# Patient Record
Sex: Male | Born: 1970 | Race: Black or African American | Hispanic: No | Marital: Single | Smoking: Current every day smoker
Health system: Southern US, Community
[De-identification: ages and names within clinical notes are randomized; demographics above are authoritative.]

## PROBLEM LIST (undated history)

## (undated) DIAGNOSIS — F259 Schizoaffective disorder, unspecified: Secondary | ICD-10-CM

---

## 2007-08-01 ENCOUNTER — Ambulatory Visit: Payer: Self-pay | Admitting: *Deleted

## 2007-08-01 ENCOUNTER — Inpatient Hospital Stay (HOSPITAL_COMMUNITY): Admission: AD | Admit: 2007-08-01 | Discharge: 2007-08-08 | Payer: Self-pay | Admitting: *Deleted

## 2007-10-20 ENCOUNTER — Emergency Department (HOSPITAL_BASED_OUTPATIENT_CLINIC_OR_DEPARTMENT_OTHER): Admission: EM | Admit: 2007-10-20 | Discharge: 2007-10-20 | Payer: Self-pay | Admitting: Emergency Medicine
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
## 2015-09-23 ENCOUNTER — Observation Stay (HOSPITAL_COMMUNITY)
Admission: EM | Admit: 2015-09-23 | Discharge: 2015-09-25 | Disposition: A | Discharge status: Home / Self Care | Payer: Medicare PPO | Attending: Internal Medicine | Admitting: Internal Medicine

## 2015-09-23 ENCOUNTER — Emergency Department (HOSPITAL_COMMUNITY): Payer: Medicare PPO

## 2015-09-23 ENCOUNTER — Encounter (HOSPITAL_COMMUNITY): Payer: Self-pay | Admitting: Emergency Medicine

## 2015-09-23 DIAGNOSIS — F17210 Nicotine dependence, cigarettes, uncomplicated: Secondary | ICD-10-CM | POA: Diagnosis not present

## 2015-09-23 DIAGNOSIS — F10220 Alcohol dependence with intoxication, uncomplicated: Secondary | ICD-10-CM | POA: Diagnosis not present

## 2015-09-23 DIAGNOSIS — F10230 Alcohol dependence with withdrawal, uncomplicated: Secondary | ICD-10-CM

## 2015-09-23 DIAGNOSIS — R400 Somnolence: Secondary | ICD-10-CM | POA: Diagnosis not present

## 2015-09-23 DIAGNOSIS — R4182 Altered mental status, unspecified: Secondary | ICD-10-CM | POA: Diagnosis present

## 2015-09-23 DIAGNOSIS — F10929 Alcohol use, unspecified with intoxication, unspecified: Secondary | ICD-10-CM

## 2015-09-23 DIAGNOSIS — F10930 Alcohol use, unspecified with withdrawal, uncomplicated: Secondary | ICD-10-CM

## 2015-09-23 DIAGNOSIS — R55 Syncope and collapse: Secondary | ICD-10-CM | POA: Diagnosis present

## 2015-09-23 DIAGNOSIS — Z79899 Other long term (current) drug therapy: Secondary | ICD-10-CM | POA: Insufficient documentation

## 2015-09-23 DIAGNOSIS — Z5181 Encounter for therapeutic drug level monitoring: Secondary | ICD-10-CM | POA: Insufficient documentation

## 2015-09-23 DIAGNOSIS — F1410 Cocaine abuse, uncomplicated: Secondary | ICD-10-CM | POA: Diagnosis not present

## 2015-09-23 DIAGNOSIS — R791 Abnormal coagulation profile: Secondary | ICD-10-CM | POA: Diagnosis not present

## 2015-09-23 DIAGNOSIS — F10939 Alcohol use, unspecified with withdrawal, unspecified: Secondary | ICD-10-CM | POA: Diagnosis present

## 2015-09-23 DIAGNOSIS — F1110 Opioid abuse, uncomplicated: Secondary | ICD-10-CM | POA: Insufficient documentation

## 2015-09-23 DIAGNOSIS — F10239 Alcohol dependence with withdrawal, unspecified: Secondary | ICD-10-CM | POA: Diagnosis present

## 2015-09-23 LAB — RAPID URINE DRUG SCREEN, HOSP PERFORMED
Amphetamines: NOT DETECTED
Barbiturates: NOT DETECTED
Benzodiazepines: NOT DETECTED
Cocaine: POSITIVE — AB
OPIATES: NOT DETECTED
Tetrahydrocannabinol: NOT DETECTED

## 2015-09-23 LAB — URINALYSIS, ROUTINE W REFLEX MICROSCOPIC
Bilirubin Urine: NEGATIVE
Glucose, UA: NEGATIVE mg/dL
Hgb urine dipstick: NEGATIVE
KETONES UR: NEGATIVE mg/dL
LEUKOCYTES UA: NEGATIVE
NITRITE: NEGATIVE
PH: 5 (ref 5.0–8.0)
Protein, ur: NEGATIVE mg/dL
Specific Gravity, Urine: 1.013 (ref 1.005–1.030)

## 2015-09-23 LAB — I-STAT VENOUS BLOOD GAS, ED
Acid-base deficit: 6 mmol/L — ABNORMAL HIGH (ref 0.0–2.0)
Bicarbonate: 19.9 mEq/L — ABNORMAL LOW (ref 20.0–24.0)
O2 SAT: 79 %
PCO2 VEN: 42.1 mmHg — AB (ref 45.0–50.0)
PH VEN: 7.283 (ref 7.250–7.300)
PO2 VEN: 48 mmHg — AB (ref 31.0–45.0)
TCO2: 21 mmol/L (ref 0–100)

## 2015-09-23 LAB — CBC WITH DIFFERENTIAL/PLATELET
BASOS ABS: 0 10*3/uL (ref 0.0–0.1)
BASOS PCT: 0 %
EOS ABS: 0 10*3/uL (ref 0.0–0.7)
EOS PCT: 0 %
HCT: 40.2 % (ref 39.0–52.0)
Hemoglobin: 13.7 g/dL (ref 13.0–17.0)
Lymphocytes Relative: 37 %
Lymphs Abs: 2 10*3/uL (ref 0.7–4.0)
MCH: 30.2 pg (ref 26.0–34.0)
MCHC: 34.1 g/dL (ref 30.0–36.0)
MCV: 88.7 fL (ref 78.0–100.0)
MONO ABS: 0.2 10*3/uL (ref 0.1–1.0)
Monocytes Relative: 3 %
NEUTROS ABS: 3.2 10*3/uL (ref 1.7–7.7)
Neutrophils Relative %: 60 %
PLATELETS: 253 10*3/uL (ref 150–400)
RBC: 4.53 MIL/uL (ref 4.22–5.81)
RDW: 15.1 % (ref 11.5–15.5)
WBC: 5.3 10*3/uL (ref 4.0–10.5)

## 2015-09-23 LAB — COMPREHENSIVE METABOLIC PANEL
ALK PHOS: 66 U/L (ref 38–126)
ALT: 45 U/L (ref 17–63)
ANION GAP: 7 (ref 5–15)
AST: 60 U/L — ABNORMAL HIGH (ref 15–41)
Albumin: 3.5 g/dL (ref 3.5–5.0)
BUN: 6 mg/dL (ref 6–20)
CALCIUM: 8.1 mg/dL — AB (ref 8.9–10.3)
CO2: 21 mmol/L — ABNORMAL LOW (ref 22–32)
Chloride: 114 mmol/L — ABNORMAL HIGH (ref 101–111)
Creatinine, Ser: 0.72 mg/dL (ref 0.61–1.24)
GFR calc Af Amer: >60 mL/min (ref >60)
GFR calc non Af Amer: >60 mL/min (ref >60)
GLUCOSE: 68 mg/dL (ref 65–99)
Potassium: 4 mmol/L (ref 3.5–5.1)
SODIUM: 142 mmol/L (ref 135–145)
TOTAL PROTEIN: 7.2 g/dL (ref 6.5–8.1)
Total Bilirubin: 0.4 mg/dL (ref 0.3–1.2)

## 2015-09-23 LAB — I-STAT ARTERIAL BLOOD GAS, ED
ACID-BASE DEFICIT: 7 mmol/L — AB (ref 0.0–2.0)
Bicarbonate: 19.6 mEq/L — ABNORMAL LOW (ref 20.0–24.0)
O2 Saturation: 93 %
TCO2: 21 mmol/L (ref 0–100)
pCO2 arterial: 44.5 mmHg (ref 35.0–45.0)
pH, Arterial: 7.252 — ABNORMAL LOW (ref 7.350–7.450)
pO2, Arterial: 78 mmHg — ABNORMAL LOW (ref 80.0–100.0)

## 2015-09-23 LAB — ACETAMINOPHEN LEVEL

## 2015-09-23 LAB — ETHANOL: ALCOHOL ETHYL (B): 258 mg/dL — AB (ref <5)

## 2015-09-23 LAB — PROTIME-INR
INR: 1.03 (ref 0.00–1.49)
PROTHROMBIN TIME: 13.7 s (ref 11.6–15.2)

## 2015-09-23 LAB — APTT: aPTT: 29 seconds (ref 24–37)

## 2015-09-23 LAB — SALICYLATE LEVEL: Salicylate Lvl: <4 mg/dL (ref 2.8–30.0)

## 2015-09-23 LAB — AMMONIA: AMMONIA: 35 umol/L (ref 9–35)

## 2015-09-23 MED ORDER — SODIUM CHLORIDE 0.9 % IV BOLUS (SEPSIS)
1000.0000 mL | Freq: Once | INTRAVENOUS | Status: AC
  Administered 2015-09-23: 1000 mL via INTRAVENOUS

## 2015-09-23 MED ORDER — ADULT MULTIVITAMIN W/MINERALS CH
1.0000 | ORAL_TABLET | Freq: Every day | ORAL | Status: DC
  Administered 2015-09-23 – 2015-09-24 (×2): 1 via ORAL
  Filled 2015-09-23 (×2): qty 1

## 2015-09-23 MED ORDER — LORAZEPAM 2 MG/ML IJ SOLN
0.0000 mg | Freq: Two times a day (BID) | INTRAMUSCULAR | Status: DC
Start: 2015-09-25 — End: 2015-09-24

## 2015-09-23 MED ORDER — SODIUM CHLORIDE 0.9 % IV SOLN
INTRAVENOUS | Status: DC
Start: 2015-09-23 — End: 2015-09-24
  Administered 2015-09-23 (×3): via INTRAVENOUS

## 2015-09-23 MED ORDER — LORAZEPAM 2 MG/ML IJ SOLN
0.0000 mg | Freq: Four times a day (QID) | INTRAMUSCULAR | Status: DC
Start: 2015-09-23 — End: 2015-09-24
  Administered 2015-09-24 (×2): 2 mg via INTRAVENOUS
  Filled 2015-09-23 (×2): qty 1

## 2015-09-23 MED ORDER — SODIUM CHLORIDE 0.9 % IV SOLN: INTRAVENOUS | Status: DC

## 2015-09-23 MED ORDER — LORAZEPAM 2 MG/ML IJ SOLN
1.0000 mg | Freq: Once | INTRAMUSCULAR | Status: AC
  Administered 2015-09-23: 1 mg via INTRAVENOUS
  Filled 2015-09-23: qty 1

## 2015-09-23 MED ORDER — SODIUM CHLORIDE 0.9% FLUSH
3.0000 mL | Freq: Two times a day (BID) | INTRAVENOUS | Status: DC
  Administered 2015-09-24 – 2015-09-25 (×3): 3 mL via INTRAVENOUS

## 2015-09-23 MED ORDER — VITAMIN B-1 100 MG PO TABS
100.0000 mg | ORAL_TABLET | Freq: Every day | ORAL | Status: DC
  Administered 2015-09-23 – 2015-09-24 (×2): 100 mg via ORAL
  Filled 2015-09-23 (×2): qty 1

## 2015-09-23 MED ORDER — LORAZEPAM 2 MG/ML IJ SOLN: 1.0000 mg | Freq: Four times a day (QID) | INTRAMUSCULAR | Status: DC | PRN

## 2015-09-23 MED ORDER — ENOXAPARIN SODIUM 40 MG/0.4ML ~~LOC~~ SOLN
40.0000 mg | SUBCUTANEOUS | Status: DC
  Administered 2015-09-23 – 2015-09-24 (×2): 40 mg via SUBCUTANEOUS
  Filled 2015-09-23 (×2): qty 0.4

## 2015-09-23 MED ORDER — THIAMINE HCL 100 MG/ML IJ SOLN: 100.0000 mg | Freq: Every day | INTRAMUSCULAR | Status: DC

## 2015-09-23 MED ORDER — MORPHINE SULFATE (PF) 2 MG/ML IV SOLN: 2.0000 mg | INTRAVENOUS | Status: DC | PRN

## 2015-09-23 MED ORDER — LORAZEPAM 1 MG PO TABS: 1.0000 mg | ORAL_TABLET | Freq: Four times a day (QID) | ORAL | Status: DC | PRN

## 2015-09-23 MED ORDER — FOLIC ACID 1 MG PO TABS
1.0000 mg | ORAL_TABLET | Freq: Every day | ORAL | Status: DC
  Administered 2015-09-23 – 2015-09-24 (×2): 1 mg via ORAL
  Filled 2015-09-23 (×2): qty 1

## 2015-09-23 NOTE — ED Notes (Signed)
Pt provided with [place] Sandwich, Ginger ale, and [employer]ack[employer] and PB (Okay'd by MD, requested by Pt).
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
## 2015-09-23 NOTE — ED Notes (Signed)
Patient transported to CT 
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
## 2015-09-23 NOTE — ED Provider Notes (Signed)
  Physical Exam  BP 127/75 mmHg  Pulse 87  Temp(Src) 97.5 F (36.4 C) (Axillary)  Resp 12  Ht 5\' 10"  (1.778 m)  Wt 180 lb (81.647 kg)  BMI 25.83 kg/m2  SpO2 100%  Physical Exam  ED Course  Procedures  MDM Care assumed at sign out from Dr. Lynelle DoctorKnapp. Patient came in altered. Given narcan with minimal improvement. CT head unremarkable. ETOH 300. Drinks alcohol daily. He is now more awake and started having tremors. I tried to have nursing staff walk him but he is very unsteady. I asked about family and friends and he is currently homeless and unable to get someone to take care of him. Started on CIWA. Will admit for alcohol withdrawal.   Richardean Canalavid H Trenda Corliss, MD 09/23/15 2128

## 2015-09-23 NOTE — ED Notes (Addendum)
Pt able to request urinal.  Pt states he recalls some of this morning/afternoon, but does not know what brought him to the ED.  Pt able to urinate independently.  Pt requesting to eat.  Will speak to MD.

## 2015-09-23 NOTE — ED Notes (Signed)
Pt awoke while RN was hooking pt up.  Pt asked "where... Where... Where am I?".  I informed pt he is in the ED and explained POC.  Pt back to sleep very easily.

## 2015-09-23 NOTE — ED Notes (Signed)
Provided PT with water and Ginger Ale

## 2015-09-23 NOTE — ED Notes (Signed)
Pt not able to ambulate independently.  Pt very unsteady on feet when attempted to stand with EMT-P.

## 2015-09-23 NOTE — ED Notes (Signed)
Admitting at bedside 

## 2015-09-23 NOTE — ED Notes (Signed)
Pt is requesting assistance with his drinking.  Pt thought today was Thursday.  Sts he does not remember most of last night or this morning.  Sts this happens to him often when he drinks.

## 2015-09-23 NOTE — Progress Notes (Signed)
Received report from ER RN

## 2015-09-23 NOTE — ED Notes (Signed)
Pt very tearful.  When asked why he is crying pt sts "just processing things".  This RN asked pt if he was having any suicidal thoughts.  Pt denies.

## 2015-09-23 NOTE — H&P (Signed)
History and Physical  Robert Hurley NWG:[id number] DOB: 1970-11-27 DOA: 09/23/2015  PCP:  No primary care provider on file.   Chief Complaint:  AMS  History of Present Illness:  Patient is a 45 yo male with history of alcoholism who came with cc of AMS. He was given narcan with no much improvement. ETOH level was high. He is currently homeless with no family to provide history. He was very sleepy, unsteady and somnolent and can't provide history.   Review of Systems:  Unable to check due to mental status.   Past Medical and Surgical History:  Unable to get due to mental status  Social History:  Unable to get due to mental status  Allergies not on file  No family history on file.    Prior to Admission medications   Not on File    Physical Exam: BP 127/75 mmHg  Pulse 87  Temp(Src) 97.5 F (36.4 C) (Axillary)  Resp 12  Ht 5\' 10"  (1.778 m)  Wt 81.647 kg (180 lb)  BMI 25.83 kg/m2  SpO2 100%  GENERAL :  sleepy HEAD:           normocephalic. EYES:            Pupils pinpoint and sluggish, EOMI.  EARS:           hearing grossly intact. NOSE:           No nasal discharge. THROAT:     Oral cavity and pharynx normal.   NECK:          supple, non-tender.  CARDIAC:    Normal S1 and S2. No gallop. No murmurs.  Vascular:     no peripheral edema.  Marland Kitchen. LUNGS:       Clear to auscultation  ABDOMEN: Positive bowel sounds. Soft, nondistended, nontender.  MSK:           No joint erythema EXT           : No significant deformity or joint abnormality. Neuro        : Alert, oriented to person, place, and time.           Labs on Admission:  Reviewed.   Radiological Exams on Admission: Ct Head Wo Contrast  09/23/2015  CLINICAL DATA:  Patient hit head against wall, then experienced a syncopal episode with fall EXAM: CT HEAD WITHOUT CONTRAST CT CERVICAL SPINE WITHOUT CONTRAST TECHNIQUE: Multidetector CT imaging of the head and cervical spine was performed following the  standard protocol without intravenous contrast. Multiplanar CT image reconstructions of the cervical spine were also generated. COMPARISON:  None. FINDINGS: CT HEAD FINDINGS The ventricles are normal in size and configuration. There is no intracranial mass, hemorrhage, extra-axial fluid collection, or midline shift. Gray-white compartments appear normal. No acute infarct evident. No vascular calcifications are evident. The bony calvarium appears intact. Orbits appear symmetric bilaterally. There is a small osteoma in the right ethmoid sinus region, a benign finding. There is a tiny retention cyst in the anterior right maxillary antrum. Other paranasal sinuses are clear. Mastoid air cells are clear. CT CERVICAL SPINE FINDINGS There is no fracture or spondylolisthesis. Prevertebral soft tissues and predental space regions are normal. There is mild disc space narrowing at C5-6, C6-7, and C7-T1. Calcification in the anterior ligament is noted at C5-6 and C7-T1. Small anterior osteophytes are noted in these lower cervical region. No nerve root edema or effacement. No disc extrusion or stenosis noted. IMPRESSION: CT head: No intracranial mass,  hemorrhage, or extra-axial fluid collection. Gray-white compartments appear normal. Rather minimal paranasal sinus disease noted. CT cervical spine: No demonstrable fracture or spondylolisthesis. 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 Electronically Signed   By: Bretta BangWilliam  Woodruff III M.D.   On: 09/23/2015 15:35   Ct Cervical Spine Wo Contrast  09/23/2015  CLINICAL DATA:  Patient hit head against wall, then experienced a syncopal episode with fall EXAM: CT HEAD WITHOUT CONTRAST CT CERVICAL SPINE WITHOUT CONTRAST TECHNIQUE: Multidetector CT imaging of the head and cervical spine was performed following the standard protocol without intravenous contrast. Multiplanar CT image reconstructions of the cervical spine were also generated. COMPARISON:  None.  FINDINGS: CT HEAD FINDINGS The ventricles are normal in size and configuration. There is no intracranial mass, hemorrhage, extra-axial fluid collection, or midline shift. Gray-white compartments appear normal. No acute infarct evident. No vascular calcifications are evident. The bony calvarium appears intact. Orbits appear symmetric bilaterally. There is a small osteoma in the right ethmoid sinus region, a benign finding. There is a tiny retention cyst in the anterior right maxillary antrum. Other paranasal sinuses are clear. Mastoid air cells are clear. CT CERVICAL SPINE FINDINGS There is no fracture or spondylolisthesis. Prevertebral soft tissues and predental space regions are normal. There is mild disc space narrowing at C5-6, C6-7, and C7-T1. Calcification in the anterior ligament is noted at C5-6 and C7-T1. Small anterior osteophytes are noted in these lower cervical region. No nerve root edema or effacement. No disc extrusion or stenosis noted. IMPRESSION: CT head: No intracranial mass, hemorrhage, or extra-axial fluid collection. Gray-white compartments appear normal. Rather minimal paranasal sinus disease noted. CT cervical spine: No demonstrable fracture or spondylolisthesis. 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 Electronically Signed   By: Bretta BangWilliam  Woodruff III M.D.   On: 09/23/2015 15:35   Dg Chest Port 1 View  09/23/2015  CLINICAL DATA:  Possible overdose, unresponsive EXAM: PORTABLE CHEST 1 VIEW COMPARISON:  None. FINDINGS: The heart size and mediastinal contours are within normal limits. Both lungs are clear. The visualized skeletal structures are unremarkable. IMPRESSION: No active disease. Electronically Signed   By: Alcide CleverMark  Lukens M.D.   On: 09/23/2015 15:03     Assessment/Plan  Altered mental status: Baseline: Ddx:   Likely iatrogenic due to alcohol and drug abuse ( cocaine)  Started on CIWA protocol for alcohol withdrawal  IVF, thiamine , folic  acid Will monitor overnight Morphine prn pain  CT head unremarkable             Input & Output: NA Lines & Tubes: PIV DVT prophylaxis:  [place] enoxaparin  GI prophylaxis: NA Consultants: NA Code Status: Full  Family Communication: None at bedside  Disposition Plan: Obs tele     Eston EstersAhmad Hendrix Console M.D [facility]

## 2015-09-24 ENCOUNTER — Encounter (HOSPITAL_COMMUNITY): Payer: Self-pay | Admitting: *Deleted

## 2015-09-24 DIAGNOSIS — R55 Syncope and collapse: Secondary | ICD-10-CM | POA: Diagnosis present

## 2015-09-24 DIAGNOSIS — F10231 Alcohol dependence with withdrawal delirium: Secondary | ICD-10-CM | POA: Diagnosis not present

## 2015-09-24 DIAGNOSIS — R400 Somnolence: Secondary | ICD-10-CM | POA: Diagnosis not present

## 2015-09-24 LAB — COMPREHENSIVE METABOLIC PANEL
ALBUMIN: 3.1 g/dL — AB (ref 3.5–5.0)
ALK PHOS: 57 U/L (ref 38–126)
ALT: 39 U/L (ref 17–63)
ANION GAP: 5 (ref 5–15)
AST: 58 U/L — ABNORMAL HIGH (ref 15–41)
BILIRUBIN TOTAL: 0.7 mg/dL (ref 0.3–1.2)
BUN: 8 mg/dL (ref 6–20)
CALCIUM: 8.6 mg/dL — AB (ref 8.9–10.3)
CO2: 23 mmol/L (ref 22–32)
Chloride: 106 mmol/L (ref 101–111)
Creatinine, Ser: 0.79 mg/dL (ref 0.61–1.24)
GFR calc Af Amer: >60 mL/min (ref >60)
GLUCOSE: 109 mg/dL — AB (ref 65–99)
Potassium: 3.4 mmol/L — ABNORMAL LOW (ref 3.5–5.1)
Sodium: 134 mmol/L — ABNORMAL LOW (ref 135–145)
TOTAL PROTEIN: 6.3 g/dL — AB (ref 6.5–8.1)

## 2015-09-24 MED ORDER — HYDROXYZINE HCL 25 MG PO TABS
25.0000 mg | ORAL_TABLET | Freq: Four times a day (QID) | ORAL | Status: DC | PRN
  Administered 2015-09-24: 25 mg via ORAL
  Filled 2015-09-24: qty 1

## 2015-09-24 MED ORDER — LORAZEPAM 1 MG PO TABS: 1.0000 mg | ORAL_TABLET | Freq: Two times a day (BID) | ORAL | Status: DC

## 2015-09-24 MED ORDER — ADULT MULTIVITAMIN W/MINERALS CH
1.0000 | ORAL_TABLET | Freq: Every day | ORAL | Status: DC
  Administered 2015-09-25: 1 via ORAL
  Filled 2015-09-24: qty 1

## 2015-09-24 MED ORDER — LOPERAMIDE HCL 2 MG PO CAPS: 2.0000 mg | ORAL_CAPSULE | ORAL | Status: DC | PRN

## 2015-09-24 MED ORDER — LORAZEPAM 1 MG PO TABS: 1.0000 mg | ORAL_TABLET | Freq: Every day | ORAL | Status: DC

## 2015-09-24 MED ORDER — LORAZEPAM 1 MG PO TABS: 1.0000 mg | ORAL_TABLET | Freq: Four times a day (QID) | ORAL | Status: DC | PRN

## 2015-09-24 MED ORDER — ONDANSETRON 4 MG PO TBDP: 4.0000 mg | ORAL_TABLET | Freq: Four times a day (QID) | ORAL | Status: DC | PRN

## 2015-09-24 MED ORDER — LORAZEPAM 1 MG PO TABS: 1.0000 mg | ORAL_TABLET | Freq: Three times a day (TID) | ORAL | Status: DC

## 2015-09-24 MED ORDER — THIAMINE HCL 100 MG/ML IJ SOLN: 100.0000 mg | Freq: Once | INTRAMUSCULAR | Status: DC

## 2015-09-24 MED ORDER — LORAZEPAM 1 MG PO TABS
1.0000 mg | ORAL_TABLET | Freq: Four times a day (QID) | ORAL | Status: AC
  Administered 2015-09-24 – 2015-09-25 (×3): 1 mg via ORAL
  Filled 2015-09-24 (×3): qty 1

## 2015-09-24 MED ORDER — VITAMIN B-1 100 MG PO TABS
100.0000 mg | ORAL_TABLET | Freq: Every day | ORAL | Status: DC
  Administered 2015-09-25: 100 mg via ORAL
  Filled 2015-09-24: qty 1

## 2015-09-24 NOTE — Clinical Social Work Note (Signed)
Clinical Social Work Assessment  Patient Details  Name: Robert Hurley MRN: [id number] Date of Birth: 1971/01/20  Date of referral:  09/24/15               Reason for consult:  Substance Use/ETOH Abuse                Permission sought to share information with:    Permission granted to share information::  No  Name::        Agency::     Relationship::     Contact Information:     Housing/Transportation Living arrangements for the past 2 months:  Single Family Home Source of Information:  Patient Patient Interpreter Needed:  None Criminal Activity/Legal Involvement Pertinent to Current Situation/Hospitalization:  No - Comment as needed Significant Relationships:  Friend Lives with:  Friends Do you feel safe going back to the place where you live?  Yes Need for family participation in patient care:  No (Coment)  Care giving concerns:  The patient does not report any care giving concerns at this time. He is interested in seeking treatment for his alcohol use.   Social Worker assessment / plan:  CSW met with patient at bedside to complete assessment. CSW was informed by MD that patient would like information on treatment options and that the patient is homeless. The patient present with severe alcohol use disorder. He currently drinks approximately 2 gallons of wine a day (self reported). He drinks vodka when wine is not available. The patient has been drinking for 20+ years. He reports that he has completed treatment at [facility] in [facility], [place] about 10 years ago. He feels this helped him, but he has found it difficult to remain sober in his current living situation as his roommate is a heavy drinker. CSW completed SBIRT with the patient and explored what triggers the patient to use alcohol. At this point in time his "painful past" and troubling memories are one of the main reasons he uses alcohol. He also endorses significant withdrawal symptoms when he is without alcohol, and to  avoid these unpleasant symptoms, the patient uses.   CSW provided patient with outpatient and residential treatment options and encouraged the patient to seek treatment with a residential program as this would likely benefit the patient the most given the negative home environment he lives in at this time. Overall the patient appeared melancholy and sad. The patient denies any significant mental health history, but does present with many symptoms of depression and anxiety. In addition to treatment options, shelter resources, list of food pantries/free meals, and transportation resources provided. Please re-consult CSW if needed.  Employment status:  Unemployed Forensic scientist:  Self Pay (Medicaid Pending) PT Recommendations:  Not assessed at this time Information / Referral to community resources:  SBIRT, Residential Substance Abuse Treatment Options, Outpatient Substance Abuse Treatment Options  Patient/Family's Response to care:  The patient appears appreciative of the care he has received and thanks CSW of providing information regarding treatment options available to the patient.   Patient/Family's Understanding of and Emotional Response to Diagnosis, Current Treatment, and Prognosis: The patient appears to have a good understanding of how his alcohol use is negatively impacting his health. Education provided to patient regarding the cycle of addiction and the impact alcohol use can have on the patient anxiety and depression symptoms.   Emotional Assessment Appearance:  Appears stated age Attitude/Demeanor/Rapport:  Other (The patient appeared to have very low mood, however he was engaged  in assessment.) Affect (typically observed):  Accepting, Calm, Flat, Quiet Orientation:  Oriented to Self, Oriented to Place, Oriented to  Time, Oriented to Situation Alcohol / Substance use:  Alcohol Use Psych involvement (Current and /or in the community):  No (Comment)  Discharge Needs  Concerns  to be addressed:  Substance Abuse Concerns Readmission within the last 30 days:  No Current discharge risk:  Substance Abuse, Lack of support system Barriers to Discharge:  Continued Medical Work up   Rigoberto Noel, LCSW 09/24/2015, 4:27 PM

## 2015-09-24 NOTE — Progress Notes (Signed)
New Admission Note:  Arrival Method: Stretcher Mental Orientation: Alert and oriented x 3-4 Telemetry: Box 15/ NSR Assessment: Completed Skin: Skin tear to left knee IV: LFA  Pain: Denies Tubes: None Safety Measures: Safety Fall Prevention Plan was  discussed with patient Admission: Completed [address] Orientation: Patient has been orientated to the room, unit and the staff. Family: None  Orders have been reviewed and implemented. Will continue to monitor the patient. Call light has been placed within reach and bed alarm has been activated.   Guilford ShiEmmanuel Granger Chui BSN, RN  Phone Number: [phone]

## 2015-09-24 NOTE — Progress Notes (Signed)
[facility] Progress Note  Patient: Robert Hurley ZOX:[id number]   PCP: No primary care provider on file. DOB: [birth year]   DOA: 09/23/2015   DOS: 09/24/2015   Date of Service: the patient was seen and examined on 09/24/2015  Subjective: The patient complains of mild anxiety as well as tremors. Denies any nausea or vomiting. Denies any hallucination. No chest pain and abdominal pain. Nutrition: Tolerating oral diet  Brief hospital course: Pt. with PMH of alcohol abuse; admitted on 09/23/2015, with complaint of syncope, was found to have alcohol intoxication as well as initial symptoms of withdrawal. Patient is currently requesting detoxification. Currently further plan is start the patient on Ativan detox protocol.  Assessment and Plan: 1. Alcohol withdrawal (HCC) Patient drinks 3 gallons of alcohol on a daily basis and different combination of beer or wine and liquor. CIWA currently 11. Patient will be started on Ativan detox protocol. We'll continue to monitor on telemetry. Social worker consulted to assist with rehabilitation references.  2. Requesting HIV test. The patient is requesting HIV and hepatitis serologies. We will order.  3. Cocaine abuse. At present no evidence of acute withdrawal. We'll continue to closely monitor on telemetry. Avoid beta blockers.  4. Syncope. Likely from intoxication. In no acute events on telemetry. Will continue to monitor.  Pain management: When necessary Tylenol Activity: Currently dependent, requesting RN to ambulate the patient Bowel regimen: last BM 09/22/2015 Diet: Regular diet DVT Prophylaxis: subcutaneous Heparin  Advance goals of care discussion: Full code  Family Communication: no family was present at bedside, at the time of interview.   Disposition:  Discharge to shelter, homeless patient. Expected discharge date: 09/25/2015.  Consultants: None Procedures: none  Antibiotics: Anti-infectives    None         Intake/Output Summary (Last 24 hours) at 09/24/15 1258 Last data filed at 09/24/15 0805  Gross per 24 hour  Intake 2087.92 ml  Output   3200 ml  Net -1112.08 ml   Filed Weights   09/23/15 1353 09/23/15 2256  Weight: 81.647 kg (180 lb) 80.6 kg (177 lb 11.1 oz)    Objective: Physical Exam: Filed Vitals:   09/23/15 2015 09/23/15 2256 09/24/15 0413 09/24/15 0837  BP: 127/75 112/82 122/80 133/79  Pulse: 87 81 75 92  Temp:  98.1 F (36.7 C) 97.7 F (36.5 C) 98 F (36.7 C)  TempSrc:  Oral Oral Oral  Resp: Height:   (1.778 m)    Weight:  80.6 kg (177 lb 11.1 oz)    SpO2: 100% 98% 96% 100%    General: Alert, Awake and Oriented to Time, Place and Person. Appear in mild distress Eyes: PERRL, Conjunctiva normal ENT: Oral Mucosa clear moist. Neck: no JVD, no Abnormal Mass Or lumps Cardiovascular: S1 and S2 Present, no Murmur, Respiratory: Bilateral Air entry equal and Decreased, Clear to Auscultation, no Crackles, no wheezes Abdomen: Bowel Sound present, Soft and no tenderness Skin: no redness, no Rash  Extremities: no Pedal edema, no calf tenderness Neurologic: Grossly no focal neuro deficit. Bilaterally Equal motor strength, mild tremor present.  Data Reviewed: CBC:  Recent Labs Lab 09/23/15 1504  WBC 5.3  NEUTROABS 3.2  HGB 13.7  HCT 40.2  MCV 88.7  PLT 253   Basic Metabolic Panel:  Recent Labs Lab 09/23/15 1504  NA 142  K 4.0  CL 114*  CO2 21*  GLUCOSE 68  BUN 6  CREATININE 0.72  CALCIUM 8.1*    Liver Function  Tests:  Recent Labs Lab 09/23/15 1504  AST 60*  ALT 45  ALKPHOS 66  BILITOT 0.4  PROT 7.2  ALBUMIN 3.5   No results for input(s): LIPASE, AMYLASE in the last 168 hours.  Recent Labs Lab 09/23/15 1505  AMMONIA 35   Coagulation Profile:  Recent Labs Lab 09/23/15 1504  INR 1.03   Cardiac Enzymes: No results for input(s): CKTOTAL, CKMB, CKMBINDEX, TROPONINI in the last 168 hours. BNP (last 3 results) No  results for input(s): PROBNP in the last 8760 hours.  CBG: No results for input(s): GLUCAP in the last 168 hours.  Studies: Ct Head Wo Contrast  09/23/2015  CLINICAL DATA:  Patient hit head against wall, then experienced a syncopal episode with fall EXAM: CT HEAD WITHOUT CONTRAST CT CERVICAL SPINE WITHOUT CONTRAST TECHNIQUE: Multidetector CT imaging of the head and cervical spine was performed following the standard protocol without intravenous contrast. Multiplanar CT image reconstructions of the cervical spine were also generated. COMPARISON:  None. FINDINGS: CT HEAD FINDINGS The ventricles are normal in size and configuration. There is no intracranial mass, hemorrhage, extra-axial fluid collection, or midline shift. Gray-white compartments appear normal. No acute infarct evident. No vascular calcifications are evident. The bony calvarium appears intact. Orbits appear symmetric bilaterally. There is a small osteoma in the right ethmoid sinus region, a benign finding. There is a tiny retention cyst in the anterior right maxillary antrum. Other paranasal sinuses are clear. Mastoid air cells are clear. CT CERVICAL SPINE FINDINGS There is no fracture or spondylolisthesis. Prevertebral soft tissues and predental space regions are normal. There is mild disc space narrowing at C5-6, C6-7, and C7-T1. Calcification in the anterior ligament is noted at C5-6 and C7-T1. Small anterior osteophytes are noted in these lower cervical region. No nerve root edema or effacement. No disc extrusion or stenosis noted. IMPRESSION: CT head: No intracranial mass, hemorrhage, or extra-axial fluid collection. Gray-white compartments appear normal. Rather minimal paranasal sinus disease noted. CT cervical spine: No demonstrable fracture or spondylolisthesis. 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 Electronically Signed   By: Bretta BangWilliam  Woodruff III M.D.   On: 09/23/2015 15:35   Ct Cervical Spine Wo  Contrast  09/23/2015  CLINICAL DATA:  Patient hit head against wall, then experienced a syncopal episode with fall EXAM: CT HEAD WITHOUT CONTRAST CT CERVICAL SPINE WITHOUT CONTRAST TECHNIQUE: Multidetector CT imaging of the head and cervical spine was performed following the standard protocol without intravenous contrast. Multiplanar CT image reconstructions of the cervical spine were also generated. COMPARISON:  None. FINDINGS: CT HEAD FINDINGS The ventricles are normal in size and configuration. There is no intracranial mass, hemorrhage, extra-axial fluid collection, or midline shift. Gray-white compartments appear normal. No acute infarct evident. No vascular calcifications are evident. The bony calvarium appears intact. Orbits appear symmetric bilaterally. There is a small osteoma in the right ethmoid sinus region, a benign finding. There is a tiny retention cyst in the anterior right maxillary antrum. Other paranasal sinuses are clear. Mastoid air cells are clear. CT CERVICAL SPINE FINDINGS There is no fracture or spondylolisthesis. Prevertebral soft tissues and predental space regions are normal. There is mild disc space narrowing at C5-6, C6-7, and C7-T1. Calcification in the anterior ligament is noted at C5-6 and C7-T1. Small anterior osteophytes are noted in these lower cervical region. No nerve root edema or effacement. No disc extrusion or stenosis noted. IMPRESSION: CT head: No intracranial mass, hemorrhage, or extra-axial fluid collection. Gray-white compartments appear normal.  Rather minimal paranasal sinus disease noted. CT cervical spine: No demonstrable fracture or spondylolisthesis. 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 Electronically Signed   By: Bretta Bang III M.D.   On: 09/23/2015 15:35   Dg Chest Port 1 View  09/23/2015  CLINICAL DATA:  Possible overdose, unresponsive EXAM: PORTABLE CHEST 1 VIEW COMPARISON:  None. FINDINGS: The heart size and  mediastinal contours are within normal limits. Both lungs are clear. The visualized skeletal structures are unremarkable. IMPRESSION: No active disease. Electronically Signed   By: Alcide Clever M.D.   On: 09/23/2015 15:03     Scheduled Meds: . enoxaparin (LOVENOX) injection  40 mg Subcutaneous Q24H  . LORazepam  1 mg Oral QID   Followed by  . [START ON 09/25/2015] LORazepam  1 mg Oral TID   Followed by  . [START ON 09/26/2015] LORazepam  1 mg Oral BID   Followed by  . [START ON 09/28/2015] LORazepam  1 mg Oral Daily  . multivitamin with minerals  1 tablet Oral Daily  . sodium chloride flush  3 mL Intravenous Q12H  . thiamine  100 mg Intramuscular Once  . [START ON 09/25/2015] thiamine  100 mg Oral Daily   Continuous Infusions:  PRN Meds: hydrOXYzine, loperamide, LORazepam, ondansetron  Time spent: 30 minutes  Author: Lynden Oxford, MD [facility] Pager: [phone] 09/24/2015 12:58 PM  If 7PM-7AM, please contact night-coverage at [email or web address], password [facility]

## 2015-09-25 DIAGNOSIS — R55 Syncope and collapse: Secondary | ICD-10-CM

## 2015-09-25 DIAGNOSIS — F10231 Alcohol dependence with withdrawal delirium: Secondary | ICD-10-CM | POA: Diagnosis not present

## 2015-09-25 DIAGNOSIS — R400 Somnolence: Secondary | ICD-10-CM

## 2015-09-25 LAB — HIV ANTIBODY (ROUTINE TESTING W REFLEX): HIV SCREEN 4TH GENERATION: NONREACTIVE

## 2015-09-25 LAB — HEPATITIS B SURFACE ANTIGEN: Hepatitis B Surface Ag: NEGATIVE

## 2015-09-25 LAB — HEPATITIS C ANTIBODY: HCV Ab: <0.1 s/co ratio (ref 0.0–0.9)

## 2015-09-25 MED ORDER — ADULT MULTIVITAMIN W/MINERALS CH
1.0000 | ORAL_TABLET | Freq: Every day | ORAL | Status: DC
Start: 2015-09-25 — End: 2015-11-28

## 2015-09-25 MED ORDER — LORAZEPAM 1 MG PO TABS: ORAL_TABLET | ORAL | Status: DC

## 2015-09-25 MED ORDER — THIAMINE HCL 100 MG PO TABS: 100.0000 mg | ORAL_TABLET | Freq: Every day | ORAL | Status: DC

## 2015-09-25 NOTE — Progress Notes (Signed)
Robert PicklerMichael E Hurley to be D/C'd Home per MD order.  Discussed prescriptions and follow up appointments with the patient. Prescriptions given to patient, medication list explained in detail. Pt verbalized understanding.    Medication List    TAKE these medications        LORazepam 1 MG tablet  Commonly known as:  ATIVAN  Take 1mg  3 times a day for 1 day,Take 1mg  twice a day for 1 day, take 1 mg daily for 1 day. then stop.     multivitamin with minerals Tabs tablet  Take 1 tablet by mouth daily.     thiamine 100 MG tablet  Take 1 tablet (100 mg total) by mouth daily.        Filed Vitals:   09/25/15 0511 09/25/15 0907  BP: 119/79 120/82  Pulse: 56 93  Temp: 97.9 F (36.6 C) 98.7 F (37.1 C)  Resp: 17 18    Skin clean, dry and intact without evidence of skin break down, no evidence of skin tears noted. IV catheter discontinued intact. Site without signs and symptoms of complications. Dressing and pressure applied. Pt denies pain at this time. No complaints noted. Patient given blue disposable scrubs to wear home. Bus pass and scripts given to patient.  An After Visit Summary was printed and given to the patient. Patient ambulated off unit, and D/C home via [facility].  Janeann ForehandLuke Ossiel Marchio BSN, RN

## 2015-09-25 NOTE — Care Management Note (Addendum)
Case Management Note  Patient Details  Name: Karn PicklerMichael E Tindol MRN: [id number] Date of Birth: 07-29-1970  Subjective/Objective:                  AMS; ETOH intoxication.   Action/Plan: CM spoke to patient at the bedside who states that he would like more information on homeless shelters incase he is unable to discharge home with family. He said that he knows that he needs to quit drinking. He said that family would be by later on today which includes his mother and brother who he thinks he will be able to live with after discharge home. CM called SW and left VM requesting education to be provided to patient. [facility] and explained follow up importance and patient verbalized understanding. CM provided coupon for Ativan for $4 at [facility] and patient said that his family would be able to provide the money for him to get that filled.   No further needs communicated at this time and will remain available should additional needs arise.   09/25/15@1042 :Cm spoke to Guilford CenterHolly with SW and advised of request to see patient; she said that she will go to meet with him at the bedside.   Expected Discharge Date:  09/25/15          Expected Discharge Plan:  Homeless Shelter VS home with family  In-House Referral:  Clinical Social Work  Discharge planning Services  CM Consult, Indigent Health Clinic, Medication Assistance  Post Acute Care Choice:    Choice offered to:     DME Arranged:    DME Agency:     HH Arranged:    HH Agency:     Status of Service:  In process, will continue to follow  If discussed at Long Length of Stay Meetings, dates discussed:    Additional Comments:  Darcel SmallingAnna C Ardean Melroy, RN 09/25/2015, 10:09 AM

## 2015-09-25 NOTE — Progress Notes (Signed)
Clinical Social Work  CSW received referral from CM who reports that pt had additional questions about housing. CSW reviewed chart and met with pt at bedside. Pt reports he was living with a friend prior to hospitalization but feels that he cannot remain sober living there. Pt plans to talk with family this afternoon but if they are unable to allow him to live with them then he wanted further information. Pt stayed at a homeless shelter in the 90s and has stayed at boarding houses in the past. Pt reports he has some income and would not want to stay at a shelter if possible. CSW provided shelter and transitioning housing list but explained that most programs have waiting lists. Pt is interested in a halfway house so that he can have support to stay sober. CSW provided [facility] locations and explained that he would have to call to schedule interviews with houses he was interested in. CSW explained that the home agrees on whether or not pt can be accepted and that he would have to complete interview first. Pt plans to stay with family and if they do not accept then he will go to shelter until he can secure housing through an [facility].  CSW is available if further needs arise.  Sindy Messing, [place] [employer]  [phone]

## 2015-09-25 NOTE — Care Management Obs Status (Signed)
MEDICARE OBSERVATION STATUS NOTIFICATION   Patient Details  Name: Robert Hurley MRN: [id number] Date of Birth: Dec 09, 1970   Medicare Observation Status Notification Given:  Yes MOON Letter explained to patient at the bedside who verbalized understanding and said that he had no questions. Copy given to patient and another placed with the CMA to be scanned.    Darcel SmallingAnna C Leilah Polimeni, RN 09/25/2015, 10:08 AM

## 2015-09-26 NOTE — Discharge Summary (Signed)
[facility] Discharge Summary   Patient: Robert Hurley ZOX:[id number]   PCP: No primary care provider on file. DOB: 1970/04/12   Date of admission: 09/23/2015   Date of discharge: 09/25/2015    Discharge Diagnoses:  Principal Problem:   Alcohol withdrawal (HCC) Active Problems:   Altered mental status   Syncope   Admitted From: home Disposition:  home  Recommendations for Outpatient Follow-up:  1. Please follow up with rehab centers for alcohol withdrawal.   Diet recommendation: regular diet  Activity: The patient is advised to gradually reintroduce usual activities.  Discharge Condition: good  Code Status: full code  History of present illness: As per the H and P dictated on admission, "Patient is a 45 yo male with history of alcoholism who came with cc of AMS. He was given narcan with no much improvement. ETOH level was high. He is currently homeless with no family to provide history. He was very sleepy, unsteady and somnolent and can't provide history. "  Hospital Course:  Summary of his active problems in the hospital is as following. 1. Alcohol withdrawal (HCC) Patient drinks 3 gallons of alcohol on a daily basis and different combination of beer or wine and liquor. CIWA currently less then 10. Patient started on Ativan detox protocol, as he requested detoxification. We'll continue to monitor on telemetry. Social worker consulted to assist with rehabilitation references. Recommendation to pt was not to mix benzodiazapine and alcohol as it can be life-threatening   2. Requesting HIV test. The patient is requesting HIV and hepatitis serologies. Negative.  3. Cocaine abuse. At present no evidence of acute withdrawal. Avoid beta blockers.  4. Syncope. Likely from intoxication. no acute events on telemetry.  All other chronic medical condition were stable during the hospitalization.  Patient was ambulatory without any assistance. On the day of the  discharge the patient's vitals were stable, and no other acute medical condition were reported by patient. the patient was felt safe to be discharge at home with family.  Procedures and Results:  none   Consultations:  none  DISCHARGE MEDICATION: Discharge Medication List as of 09/25/2015 12:23 PM    START taking these medications   Details  LORazepam (ATIVAN) 1 MG tablet Take  3 times a day for 1 day,Take  twice a day for 1 day, take 1 mg daily for 1 day. then stop., Print    Multiple Vitamin (MULTIVITAMIN WITH MINERALS) TABS tablet Take 1 tablet by mouth daily., Starting 09/25/2015, Until Discontinued, Print    thiamine 100 MG tablet Take 1 tablet (100 mg total) by mouth daily., Starting 09/25/2015, Until Discontinued, Normal       No Known Allergies Discharge Instructions    Diet general    Complete by:  As directed      Discharge instructions    Complete by:  As directed   It is important that you read following instructions as well as go over your medication list with RN to help you understand your care after this hospitalization.  Discharge Instructions: Please follow-up with PCP in one week  Please request your primary care physician to go over all Hospital Tests and Procedure/Radiological results at the follow up,  Please get all Hospital records sent to your PCP by signing hospital release before you go home.   Do not drive, operating heavy machinery, perform activities at heights, swimming or participation in water activities or provide baby sitting services ; until you have been seen by Primary Care Physician  or a Neurologist and advised to do so again. Do not take more than prescribed Pain, Sleep and Anxiety Medications. You were cared for by a hospitalist during your hospital stay. If you have any questions about your discharge medications or the care you received while you were in the hospital after you are discharged, you can call the unit and ask to speak with  the hospitalist on call if the hospitalist that took care of you is not available.  Once you are discharged, your primary care physician will handle any further medical issues. Please note that NO REFILLS for any discharge medications will be authorized once you are discharged, as it is imperative that you return to your primary care physician (or establish a relationship with a primary care physician if you do not have one) for your aftercare needs so that they can reassess your need for medications and monitor your lab values. You Must read complete instructions/literature along with all the possible adverse reactions/side effects for all the Medicines you take and that have been prescribed to you. Take any new Medicines after you have completely understood and accept all the possible adverse reactions/side effects. Wear Seat belts while driving. If you have smoked or chewed Tobacco in the last 2 yrs please stop smoking and/or stop any Recreational drug use.     Increase activity slowly    Complete by:  As directed           Discharge Exam: Filed Weights   09/23/15 1353 09/23/15 2256 09/24/15 2138  Weight: 81.647 kg (180 lb) 80.6 kg (177 lb 11.1 oz) 80.967 kg (178 lb 8 oz)   Filed Vitals:   09/25/15 0511 09/25/15 0907  BP: 119/79 120/82  Pulse: 56 93  Temp: 97.9 F (36.6 C) 98.7 F (37.1 C)  Resp: 17 18   General: Appear in no distress, no Rash; Oral Mucosa moist. Cardiovascular: S1 and S2 Present, no Murmur, no JVD Respiratory: Bilateral Air entry present and Clear to Auscultation, no Crackles, no wheezes Abdomen: Bowel Sound present, Soft and no tenderness Extremities: no Pedal edema, no calf tenderness Neurology: Grossly no focal neuro deficit.  The results of significant diagnostics from this hospitalization (including imaging, microbiology, ancillary and laboratory) are listed below for reference.    Significant Diagnostic Studies: Ct Head Wo Contrast  09/23/2015  CLINICAL  DATA:  Patient hit head against wall, then experienced a syncopal episode with fall EXAM: CT HEAD WITHOUT CONTRAST CT CERVICAL SPINE WITHOUT CONTRAST TECHNIQUE: Multidetector CT imaging of the head and cervical spine was performed following the standard protocol without intravenous contrast. Multiplanar CT image reconstructions of the cervical spine were also generated. COMPARISON:  None. FINDINGS: CT HEAD FINDINGS The ventricles are normal in size and configuration. There is no intracranial mass, hemorrhage, extra-axial fluid collection, or midline shift. Gray-white compartments appear normal. No acute infarct evident. No vascular calcifications are evident. The bony calvarium appears intact. Orbits appear symmetric bilaterally. There is a small osteoma in the right ethmoid sinus region, a benign finding. There is a tiny retention cyst in the anterior right maxillary antrum. Other paranasal sinuses are clear. Mastoid air cells are clear. CT CERVICAL SPINE FINDINGS There is no fracture or spondylolisthesis. Prevertebral soft tissues and predental space regions are normal. There is mild disc space narrowing at C5-6, C6-7, and C7-T1. Calcification in the anterior ligament is noted at C5-6 and C7-T1. Small anterior osteophytes are noted in these lower cervical region. No nerve root edema or effacement. No  disc extrusion or stenosis noted. IMPRESSION: CT head: No intracranial mass, hemorrhage, or extra-axial fluid collection. Gray-white compartments appear normal. Rather minimal paranasal sinus disease noted. CT cervical spine: No demonstrable fracture or spondylolisthesis. 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 Electronically Signed   By: Bretta Bang III M.D.   On: 09/23/2015 15:35   Ct Cervical Spine Wo Contrast  09/23/2015  CLINICAL DATA:  Patient hit head against wall, then experienced a syncopal episode with fall EXAM: CT HEAD WITHOUT CONTRAST CT CERVICAL SPINE WITHOUT  CONTRAST TECHNIQUE: Multidetector CT imaging of the head and cervical spine was performed following the standard protocol without intravenous contrast. Multiplanar CT image reconstructions of the cervical spine were also generated. COMPARISON:  None. FINDINGS: CT HEAD FINDINGS The ventricles are normal in size and configuration. There is no intracranial mass, hemorrhage, extra-axial fluid collection, or midline shift. Gray-white compartments appear normal. No acute infarct evident. No vascular calcifications are evident. The bony calvarium appears intact. Orbits appear symmetric bilaterally. There is a small osteoma in the right ethmoid sinus region, a benign finding. There is a tiny retention cyst in the anterior right maxillary antrum. Other paranasal sinuses are clear. Mastoid air cells are clear. CT CERVICAL SPINE FINDINGS There is no fracture or spondylolisthesis. Prevertebral soft tissues and predental space regions are normal. There is mild disc space narrowing at C5-6, C6-7, and C7-T1. Calcification in the anterior ligament is noted at C5-6 and C7-T1. Small anterior osteophytes are noted in these lower cervical region. No nerve root edema or effacement. No disc extrusion or stenosis noted. IMPRESSION: CT head: No intracranial mass, hemorrhage, or extra-axial fluid collection. Gray-white compartments appear normal. Rather minimal paranasal sinus disease noted. CT cervical spine: No demonstrable fracture or spondylolisthesis. 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 Electronically Signed   By: Bretta Bang III M.D.   On: 09/23/2015 15:35   Dg Chest Port 1 View  09/23/2015  CLINICAL DATA:  Possible overdose, unresponsive EXAM: PORTABLE CHEST 1 VIEW COMPARISON:  None. FINDINGS: The heart size and mediastinal contours are within normal limits. Both lungs are clear. The visualized skeletal structures are unremarkable. IMPRESSION: No active disease. Electronically Signed   By:  Alcide Clever M.D.   On: 09/23/2015 15:03    Microbiology: No results found for this or any previous visit (from the past 240 hour(s)).   Labs: CBC:  Recent Labs Lab 09/23/15 1504  WBC 5.3  NEUTROABS 3.2  HGB 13.7  HCT 40.2  MCV 88.7  PLT 253   Basic Metabolic Panel:  Recent Labs Lab 09/23/15 1504 09/24/15 1317  NA 142 134*  K 4.0 3.4*  CL 114* 106  CO2 21* 23  GLUCOSE 68 109*  BUN 6 8  CREATININE 0.72 0.79  CALCIUM 8.1* 8.6*   Liver Function Tests:  Recent Labs Lab 09/23/15 1504 09/24/15 1317  AST 60* 58*  ALT 45 39  ALKPHOS 66 57  BILITOT 0.4 0.7  PROT 7.2 6.3*  ALBUMIN 3.5 3.1*   No results for input(s): LIPASE, AMYLASE in the last 168 hours.  Recent Labs Lab 09/23/15 1505  AMMONIA 35   Cardiac Enzymes: No results for input(s): CKTOTAL, CKMB, CKMBINDEX, TROPONINI in the last 168 hours. BNP (last 3 results) No results for input(s): BNP in the last 8760 hours. CBG: No results for input(s): GLUCAP in the last 168 hours. Time spent: 30 minutes  Signed:  Lynden Oxford  [facility] 09/25/2015 , 7:51  AM

## 2015-11-18 ENCOUNTER — Emergency Department (HOSPITAL_COMMUNITY): Payer: Medicare PPO

## 2015-11-18 ENCOUNTER — Emergency Department (HOSPITAL_COMMUNITY)
Admission: EM | Admit: 2015-11-18 | Discharge: 2015-11-19 | Disposition: A | Discharge status: Other Acute Inpatient Hospital | Payer: Medicare PPO | Attending: Emergency Medicine | Admitting: Emergency Medicine

## 2015-11-18 ENCOUNTER — Encounter (HOSPITAL_COMMUNITY): Payer: Self-pay

## 2015-11-18 DIAGNOSIS — F10929 Alcohol use, unspecified with intoxication, unspecified: Secondary | ICD-10-CM

## 2015-11-18 DIAGNOSIS — F17210 Nicotine dependence, cigarettes, uncomplicated: Secondary | ICD-10-CM | POA: Diagnosis not present

## 2015-11-18 DIAGNOSIS — Z5181 Encounter for therapeutic drug level monitoring: Secondary | ICD-10-CM | POA: Diagnosis not present

## 2015-11-18 DIAGNOSIS — F10120 Alcohol abuse with intoxication, uncomplicated: Secondary | ICD-10-CM | POA: Diagnosis not present

## 2015-11-18 DIAGNOSIS — R443 Hallucinations, unspecified: Secondary | ICD-10-CM | POA: Diagnosis not present

## 2015-11-18 DIAGNOSIS — F10239 Alcohol dependence with withdrawal, unspecified: Secondary | ICD-10-CM | POA: Diagnosis present

## 2015-11-18 DIAGNOSIS — F10939 Alcohol use, unspecified with withdrawal, unspecified: Secondary | ICD-10-CM | POA: Diagnosis present

## 2015-11-18 DIAGNOSIS — R4182 Altered mental status, unspecified: Secondary | ICD-10-CM | POA: Diagnosis present

## 2015-11-18 LAB — URINALYSIS, ROUTINE W REFLEX MICROSCOPIC
Bilirubin Urine: NEGATIVE
GLUCOSE, UA: NEGATIVE mg/dL
HGB URINE DIPSTICK: NEGATIVE
Ketones, ur: NEGATIVE mg/dL
Leukocytes, UA: NEGATIVE
Nitrite: NEGATIVE
PH: 5 (ref 5.0–8.0)
Protein, ur: NEGATIVE mg/dL
SPECIFIC GRAVITY, URINE: 1.006 (ref 1.005–1.030)

## 2015-11-18 LAB — CBG MONITORING, ED: GLUCOSE-CAPILLARY: 91 mg/dL (ref 65–99)

## 2015-11-18 LAB — CBC WITH DIFFERENTIAL/PLATELET
BASOS ABS: 0 10*3/uL (ref 0.0–0.1)
BASOS PCT: 0 %
Eosinophils Absolute: 0 10*3/uL (ref 0.0–0.7)
Eosinophils Relative: 1 %
HEMATOCRIT: 37.4 % — AB (ref 39.0–52.0)
Hemoglobin: 13.1 g/dL (ref 13.0–17.0)
Lymphocytes Relative: 52 %
Lymphs Abs: 2.4 10*3/uL (ref 0.7–4.0)
MCH: 29.8 pg (ref 26.0–34.0)
MCHC: 35 g/dL (ref 30.0–36.0)
MCV: 85 fL (ref 78.0–100.0)
MONO ABS: 0.2 10*3/uL (ref 0.1–1.0)
Monocytes Relative: 5 %
NEUTROS ABS: 1.9 10*3/uL (ref 1.7–7.7)
NEUTROS PCT: 42 %
Platelets: 269 10*3/uL (ref 150–400)
RBC: 4.4 MIL/uL (ref 4.22–5.81)
RDW: 13.8 % (ref 11.5–15.5)
WBC: 4.6 10*3/uL (ref 4.0–10.5)

## 2015-11-18 LAB — ETHANOL: Alcohol, Ethyl (B): 167 mg/dL — ABNORMAL HIGH (ref <5)

## 2015-11-18 LAB — COMPREHENSIVE METABOLIC PANEL
ALBUMIN: 4.4 g/dL (ref 3.5–5.0)
ALT: 34 U/L (ref 17–63)
AST: 71 U/L — AB (ref 15–41)
Alkaline Phosphatase: 49 U/L (ref 38–126)
Anion gap: 11 (ref 5–15)
BILIRUBIN TOTAL: 0.7 mg/dL (ref 0.3–1.2)
BUN: 9 mg/dL (ref 6–20)
CHLORIDE: 107 mmol/L (ref 101–111)
CO2: 22 mmol/L (ref 22–32)
Calcium: 8.7 mg/dL — ABNORMAL LOW (ref 8.9–10.3)
Creatinine, Ser: 0.66 mg/dL (ref 0.61–1.24)
GFR calc Af Amer: >60 mL/min (ref >60)
GFR calc non Af Amer: >60 mL/min (ref >60)
GLUCOSE: 89 mg/dL (ref 65–99)
POTASSIUM: 4 mmol/L (ref 3.5–5.1)
SODIUM: 140 mmol/L (ref 135–145)
TOTAL PROTEIN: 7.9 g/dL (ref 6.5–8.1)

## 2015-11-18 LAB — AMMONIA: Ammonia: 25 umol/L (ref 9–35)

## 2015-11-18 LAB — RAPID URINE DRUG SCREEN, HOSP PERFORMED
AMPHETAMINES: NOT DETECTED
BENZODIAZEPINES: NOT DETECTED
Barbiturates: NOT DETECTED
COCAINE: NOT DETECTED
OPIATES: NOT DETECTED
TETRAHYDROCANNABINOL: NOT DETECTED

## 2015-11-18 LAB — I-STAT CG4 LACTIC ACID, ED
Lactic Acid, Venous: 2.05 mmol/L (ref 0.5–1.9)
Lactic Acid, Venous: 1.78 mmol/L (ref 0.5–1.9)
Lactic Acid, Venous: 1.69 mmol/L (ref 0.5–1.9)

## 2015-11-18 LAB — SALICYLATE LEVEL: Salicylate Lvl: <4 mg/dL (ref 2.8–30.0)

## 2015-11-18 MED ORDER — THIAMINE HCL 100 MG/ML IJ SOLN: 100.0000 mg | Freq: Every day | INTRAMUSCULAR | Status: DC

## 2015-11-18 MED ORDER — SODIUM CHLORIDE 0.9 % IV BOLUS (SEPSIS)
1000.0000 mL | Freq: Once | INTRAVENOUS | Status: AC
  Administered 2015-11-18: 1000 mL via INTRAVENOUS

## 2015-11-18 MED ORDER — LORAZEPAM 1 MG PO TABS: 0.0000 mg | ORAL_TABLET | Freq: Two times a day (BID) | ORAL | Status: DC

## 2015-11-18 MED ORDER — ADULT MULTIVITAMIN W/MINERALS CH
1.0000 | ORAL_TABLET | Freq: Once | ORAL | Status: AC
  Administered 2015-11-18: 1 via ORAL
  Filled 2015-11-18: qty 1

## 2015-11-18 MED ORDER — ONDANSETRON HCL 4 MG/2ML IJ SOLN
4.0000 mg | Freq: Once | INTRAMUSCULAR | Status: AC
  Administered 2015-11-18: 4 mg via INTRAVENOUS
  Filled 2015-11-18: qty 2

## 2015-11-18 MED ORDER — FOLIC ACID 1 MG PO TABS
1.0000 mg | ORAL_TABLET | Freq: Once | ORAL | Status: AC
Start: 2015-11-18 — End: 2015-11-18
  Administered 2015-11-18: 1 mg via ORAL
  Filled 2015-11-18: qty 1

## 2015-11-18 MED ORDER — NALOXONE HCL 2 MG/2ML IJ SOSY
1.0000 mg | PREFILLED_SYRINGE | Freq: Once | INTRAMUSCULAR | Status: AC
  Administered 2015-11-18: 1 mg via INTRAVENOUS
  Filled 2015-11-18: qty 2

## 2015-11-18 MED ORDER — FAMOTIDINE IN NACL 20-0.9 MG/50ML-% IV SOLN
20.0000 mg | Freq: Once | INTRAVENOUS | Status: AC
  Administered 2015-11-18: 20 mg via INTRAVENOUS
  Filled 2015-11-18: qty 50

## 2015-11-18 MED ORDER — ONDANSETRON HCL 4 MG PO TABS: 4.0000 mg | ORAL_TABLET | Freq: Three times a day (TID) | ORAL | Status: DC | PRN

## 2015-11-18 MED ORDER — VITAMIN B-1 100 MG PO TABS
100.0000 mg | ORAL_TABLET | Freq: Every day | ORAL | Status: DC
  Administered 2015-11-18 – 2015-11-19 (×2): 100 mg via ORAL
  Filled 2015-11-18 (×2): qty 1

## 2015-11-18 MED ORDER — LORAZEPAM 1 MG PO TABS
0.0000 mg | ORAL_TABLET | Freq: Four times a day (QID) | ORAL | Status: DC
  Administered 2015-11-18 (×2): 1 mg via ORAL
  Filled 2015-11-18 (×2): qty 1

## 2015-11-18 NOTE — BH Assessment (Signed)
[facility] Assessment Progress Note  Case was staffed with [name] who recommended patient be monitored and re-evaluated in the a.m.     

## 2015-11-18 NOTE — ED Notes (Signed)
Report to include Situation, Background, Assessment, and Recommendations received from Diane RN. Patient alert and oriented, warm and dry, in no acute distress. Patient denies SI, HI, AVH and pain. Patient made aware of Q15 minute rounds and security cameras for their safety. Patient instructed to come to me with needs or concerns. 

## 2015-11-18 NOTE — BH Assessment (Addendum)
Assessment Note  Robert Hurley is an 45 y.o. male that presents this date being brought in by EMS after patient was found being impaired at a local gas station. Patient is a poor historian with limited history. Patient is time/place oriented but states he is with "several drug cartels" when asked about his SA use. Patient denies any S/I, H/I but active AVH. Patient states he hears governmental officials speaking to him about "what to do next." Patient was very vague in reference to symptoms stating "you would not understand." Patient states he often has "visions sent by Internet CEO's." Patient seems to be responding to internal stimuli at times. Patient is drowsy and this writer had to ask questions seveal times with patient being very unresponsive at times. Per note review, [facility] for ETOH use and was positive for cocaine. Patient denies any current use of cocaine or any other illicit substances. Patient denies being on any MH medications or history of mental health issues. Patient does admit to history of depression but denies any current symptoms.Patient is currently on supervised probation for assault on public official per notes. 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 Patient states he resides by himself in his "new home he built. Per notes, patient has a history of MDD and ETOH abuse.  Admission note stated: "Pt BIB [facility] from the parking lot of a gas station. Per EMS pt unable to stand up on his own or follow commands. Pt increasingly lethargic. Pin point pupils. Unable to answer questions. Pt only stated to EMS that he has been drinking." Case was staffed with [name] who recommended patient be monitored and re-evaluated in the a.m.   Diagnosis: MDD recurrent with psychotic features, severe, Alcohol abuse severe  Past  Medical History: History reviewed. No pertinent past medical history.  History reviewed. No pertinent surgical history.  Family History: History reviewed. No pertinent family history.  Social History:  reports that he has been smoking Cigarettes.  He does not have any smokeless tobacco history on file. His alcohol and drug histories are not on file.  Additional Social History:  Alcohol / Drug Use Pain Medications: See MAR Prescriptions: See MAR Over the Counter: See MAR History of alcohol / drug use?: Yes Longest period of sobriety (when/how long): pt cannot remember  Withdrawal Symptoms:  (pt currently denies) Substance #1 Name of Substance 1: Alcohol 1 - Age of First Use: 22 1 - Amount (size/oz): 1/2 gallon a day of liquor 1 - Frequency: daily 1 - Duration: Last two years 1 - Last Use / Amount: 11/18/15 pt reports consuming 1/5 th of liquor  CIWA: CIWA-Ar BP: 128/92 Pulse Rate: 82 Nausea and Vomiting: no nausea and no vomiting Tactile Disturbances: none Tremor: no tremor Auditory Disturbances: not present Paroxysmal Sweats: no sweat visible Visual Disturbances: not present Anxiety: no anxiety, at ease Headache, Fullness in Head: none present Agitation: normal activity Orientation and Clouding of Sensorium: cannot do serial additions or is uncertain about date CIWA-Ar Total: 1 COWS:    Allergies: No Known Allergies  Home Medications:  (Not in a hospital admission)  OB/GYN Status:  No LMP for male patient.  General Assessment Data Location of Assessment: [facility] ED TTS Assessment: In system Is this a Tele or Face-to-Face Assessment?: Face-to-Face Is this  an Initial Assessment or a Re-assessment for this encounter?: Initial Assessment Marital status: Single Maiden name: na  Is patient pregnant?: No Pregnancy Status: No Living Arrangements: Alone Can pt return to current living arrangement?: Yes Admission Status: Voluntary Is patient capable of signing voluntary  admission?: Yes Referral Source: Self/Family/Friend Insurance type: Medicare  Medical Screening Exam [facility] Walk-in ONLY) Medical Exam completed: Yes  Crisis Care Plan Living Arrangements: Alone Legal Guardian: Other: (na) Name of Psychiatrist: None Name of Therapist: None  Education Status Is patient currently in school?: No Current Grade: na Highest grade of school patient has completed: 12 Name of school: na Contact person: na  Risk to self with the past 6 months Suicidal Ideation: No Has patient been a risk to self within the past 6 months prior to admission? : No Suicidal Intent: No Has patient had any suicidal intent within the past 6 months prior to admission? : No Is patient at risk for suicide?: No Suicidal Plan?: No Has patient had any suicidal plan within the past 6 months prior to admission? : No Access to Means: No What has been your use of drugs/alcohol within the last 12 months?: current use Previous Attempts/Gestures: No How many times?: 0 Other Self Harm Risks: na Triggers for Past Attempts: Unknown Intentional Self Injurious Behavior: None Family Suicide History: No Recent stressful life event(s): Other (Comment) (none per patient) Persecutory voices/beliefs?: No Depression: Yes Depression Symptoms: Loss of interest in usual pleasures, Feeling worthless/self pity Substance abuse history and/or treatment for substance abuse?: Yes Suicide prevention information given to non-admitted patients: Not applicable  Risk to Others within the past 6 months Homicidal Ideation: No Does patient have any lifetime risk of violence toward others beyond the six months prior to admission? : No Thoughts of Harm to Others: No Current Homicidal Intent: No Current Homicidal Plan: No Access to Homicidal Means: No Identified Victim: na History of harm to others?: Yes Assessment of Violence: None Noted Violent Behavior Description: assault on official Does patient have  access to weapons?: No Criminal Charges Pending?: No Does patient have a court date: No Is patient on probation?: Yes  Psychosis Hallucinations: Auditory, Visual Delusions: Grandiose  Mental Status Report Appearance/Hygiene: Unremarkable Eye Contact: Fair Motor Activity: Agitation Speech: Argumentative Level of Consciousness: Alert Mood: Anxious Affect: Anxious Anxiety Level: Moderate Thought Processes: Circumstantial Judgement: Impaired Orientation: Person, Place, Time Obsessive Compulsive Thoughts/Behaviors: None  Cognitive Functioning Concentration: Decreased Memory: Recent Impaired IQ: Average Insight: Poor Impulse Control: Poor Appetite: Fair Weight Loss: 0 Weight Gain: 0 Sleep: No Change Total Hours of Sleep: 6 Vegetative Symptoms: None  ADLScreening [facility] Assessment Services) Patient's cognitive ability adequate to safely complete daily activities?: Yes Patient able to express need for assistance with ADLs?: Yes Independently performs ADLs?: No  Prior Inpatient Therapy Prior Inpatient Therapy: Yes Prior Therapy Dates: 2017 Prior Therapy Facilty/Provider(s): [facility] Reason for Treatment: SA issues, MH issues  Prior Outpatient Therapy Prior Outpatient Therapy: Yes Prior Therapy Dates: 2017 Prior Therapy Facilty/Provider(s): [facility] Reason for Treatment: SA use Does patient have an ACCT team?: No Does patient have Intensive In-House Services?  : No Does patient have [facility] services? : No Does patient have P4CC services?: No  ADL Screening (condition at time of admission) Patient's cognitive ability adequate to safely complete daily activities?: Yes Is the patient deaf or have difficulty hearing?: No Does the patient have difficulty seeing, even when wearing glasses/contacts?: No Does the patient have difficulty concentrating, remembering, or making decisions?: No Patient able to express  need for assistance with ADLs?: Yes Does the patient have difficulty  dressing or bathing?: No Independently performs ADLs?: No Does the patient have difficulty walking or climbing stairs?: No Weakness of Legs: None Weakness of Arms/Hands: None  Home Assistive Devices/Equipment Home Assistive Devices/Equipment: None  Therapy Consults (therapy consults require a physician order) PT Evaluation Needed: No OT Evalulation Needed: No SLP Evaluation Needed: No Abuse/Neglect Assessment (Assessment to be complete while patient is alone) Physical Abuse: Denies Verbal Abuse: Denies Sexual Abuse: Denies Exploitation of patient/patient's resources: Denies Self-Neglect: Denies Values / Beliefs Cultural Requests During Hospitalization: None Spiritual Requests During Hospitalization: None Consults Spiritual Care Consult Needed: No Social Work Consult Needed: No Merchant navy officer (For Healthcare) Does patient have an advance directive?: No Would patient like information on creating an advanced directive?: No - patient declined information    Additional Information 1:1 In Past 12 Months?: No CIRT Risk: No Elopement Risk: No Does patient have medical clearance?: Yes     Disposition:  Case was staffed with [name] who recommended patient be monitored and re-evaluated in the a.m. Disposition Initial Assessment Completed for this Encounter: Yes Disposition of Patient: Inpatient treatment program Type of inpatient treatment program: Adult  On Site Evaluation by:   Reviewed with Physician:    Alfredia Ferguson 11/18/2015 11:27 AM

## 2015-11-18 NOTE — ED Notes (Signed)
Pt's affect is flat and his mood is depressed and sad. Denies SI/HI and denies AVH at this time. Reports problem with alcohol consumption.

## 2015-11-18 NOTE — ED Notes (Signed)
Patient noted sleeping in room. No complaints, stable, in no acute distress. Q15 minute rounds and monitoring via Security Cameras to continue.  

## 2015-11-18 NOTE — Progress Notes (Signed)
Date: 11/18/15 Time: 1350 Location: [facility]  Group Topic: Wellness  Goal Area(s) Addresses:  Patient will define components of whole wellness. Patient will verbalize benefit of whole wellness.  Intervention: Beach ball  Activity: Keep it Going Volleyball.  Patients were to stand in a circle and pass the beach ball to the persons across from them.  The ball could bounce off the floor but it could not roll to a stop on the ground.  Education: Wellness, Discharge Planning.   Education Outcome: Needs additional education.   Clinical Observations/Feedback:  Pt did not attend activity.    Alfonso Carden, LRT/CTRS   

## 2015-11-18 NOTE — ED Provider Notes (Signed)
[facility] DEPT Provider Note   CSN: [id number] Arrival date & time: 11/18/15  0359     History   Chief Complaint Chief Complaint  Patient presents with  . Altered Mental Status     HPI Robert Hurley is a 45 y.o. male who presents following alcohol intoxication. Patient was found minimally responsive outside of the gas station this morning. On reassessment with patient is sober, patient states he does not remember how he got here. Patient does remember drinking a lot last night. He states he drank a gallon or gallon and half of alcohol (wine and beer) last evening. Patient states he regularly drinks amounts like this. Patient states that he drinks because of his life stressors including financial problems. 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 Patient is unable to tell me for certain if he is suicidal or homicidal or if he has a plan. Patient unclear following multiple questions whether he has access to firearms. Patient states that he would "do what ever to protect myself." Patient reports recent visions of him hanging in a tree by his neck. The patient denies any chest pain, shortness of breath, abdominal pain, nausea, or vomiting. Patient states he gets intermittent dysuria, which has told due to his drinking. Patient does stress that he needs help for his drinking.  HPI  History reviewed. No pertinent past medical history.  Patient Active Problem List   Diagnosis Date Noted  . Syncope 09/24/2015  . Alcohol withdrawal (HCC) 09/23/2015  . Altered mental status 09/23/2015    History reviewed. No pertinent surgical history.     Home Medications    Prior to Admission medications   Medication Sig Start Date End Date Taking? Authorizing Provider  LORazepam (ATIVAN) 1 MG tablet Take 1mg  3 times a day for 1 day,Take 1mg  twice a day for 1 day, take 1 mg daily for 1 day. then stop. Patient not taking:  Reported on 11/18/2015 09/25/15   Rolly Salter, MD  Multiple Vitamin (MULTIVITAMIN WITH MINERALS) TABS tablet Take 1 tablet by mouth daily. Patient not taking: Reported on 11/18/2015 09/25/15   Rolly Salter, MD  thiamine 100 MG tablet Take 1 tablet (100 mg total) by mouth daily. Patient not taking: Reported on 11/18/2015 09/25/15   Rolly Salter, MD    Family History History reviewed. No pertinent family history.  Social History Social History  Substance Use Topics  . Smoking status: Current Every Day Smoker    Types: Cigarettes  . Smokeless tobacco: Not on file  . Alcohol use Not on file     Allergies   Review of patient's allergies indicates no known allergies.   Review of Systems Review of Systems  Constitutional: Negative for chills and fever.  HENT: Negative for facial swelling and sore throat.   Respiratory: Negative for shortness of breath.   Cardiovascular: Negative for chest pain.  Gastrointestinal: Negative for abdominal pain, nausea and vomiting.  Genitourinary: Positive for dysuria (intermittent).  Musculoskeletal: Negative for back pain.  Skin: Negative for rash and wound.  Neurological: Negative for headaches.  Psychiatric/Behavioral: The patient is not nervous/anxious.      Physical Exam Updated Vital Signs BP 139/94 (BP Location: Right Arm)   Pulse 84   Temp 98.3 F (36.8 C) (Oral)   Resp 20   Ht 5\' 8"  (1.727 m)   Wt 81.6 kg   SpO2 96%   BMI 27.37 kg/m  Physical Exam  Constitutional: He appears well-developed and well-nourished. No distress.  HENT:  Head: Normocephalic and atraumatic.  Mouth/Throat: Oropharynx is clear and moist. No oropharyngeal exudate.  Eyes: Conjunctivae are normal. Pupils are equal, round, and reactive to light. Right eye exhibits no discharge. Left eye exhibits no discharge. No scleral icterus.  Neck: Normal range of motion. Neck supple. No thyromegaly present.  Cardiovascular: Normal rate, regular rhythm, normal heart sounds  and intact distal pulses.  Exam reveals no gallop and no friction rub.   No murmur heard. Pulmonary/Chest: Effort normal and breath sounds normal. No stridor. No respiratory distress. He has no wheezes. He has no rales.  Abdominal: Soft. Bowel sounds are normal. He exhibits no distension. There is tenderness in the right lower quadrant and left lower quadrant. There is no rebound and no guarding.  Musculoskeletal: He exhibits no edema.  Lymphadenopathy:    He has no cervical adenopathy.  Neurological: He is alert. Coordination normal.  Skin: Skin is warm and dry. No rash noted. He is not diaphoretic. No pallor.  Psychiatric: His speech is tangential. Thought content is delusional.  Nursing note and vitals reviewed.    ED Treatments / Results  Labs (all labs ordered are listed, but only abnormal results are displayed) Labs Reviewed  CBC WITH DIFFERENTIAL/PLATELET - Abnormal; Notable for the following:       Result Value   HCT 37.4 (*)    All other components within normal limits  COMPREHENSIVE METABOLIC PANEL - Abnormal; Notable for the following:    Calcium 8.7 (*)    AST 71 (*)    All other components within normal limits  ETHANOL - Abnormal; Notable for the following:    Alcohol, Ethyl (B) 167 (*)    All other components within normal limits  I-STAT CG4 LACTIC ACID, ED - Abnormal; Notable for the following:    Lactic Acid, Venous 2.05 (*)    All other components within normal limits  URINALYSIS, ROUTINE W REFLEX MICROSCOPIC (NOT [facility])  AMMONIA  URINE RAPID DRUG SCREEN, HOSP PERFORMED  SALICYLATE LEVEL  CBG MONITORING, ED  I-STAT CG4 LACTIC ACID, ED  I-STAT CG4 LACTIC ACID, ED    EKG  EKG Interpretation  Date/Time:  Friday November 18 2015 04:06:08 EDT Ventricular Rate:  101 PR Interval:    QRS Duration: 86 QT Interval:  370 QTC Calculation: 480 R Axis:   71 Text Interpretation:  Sinus tachycardia Borderline prolonged QT interval No previous ECGs available  Confirmed by Read DriversMOLPUS  MD, Jonny RuizJOHN ([id number]) on 11/18/2015 4:16:07 AM Also confirmed by Read DriversMOLPUS  MD, Jonny RuizJOHN ([id number]), editor St[place] CT, Jola BabinskiMarilyn [phone])  on 11/18/2015 8:50:59 AM       Radiology Dg Chest 2 View  Result Date: 11/18/2015 CLINICAL DATA:  Altered mental status EXAM: CHEST  2 VIEW COMPARISON:  None. FINDINGS: The lungs are clear. The pulmonary vasculature is normal. Heart size is normal. Hilar and mediastinal contours are unremarkable. There is no pleural effusion. IMPRESSION: No active cardiopulmonary disease. Electronically Signed   By: Ellery Plunkaniel R Mitchell M.D.   On: 11/18/2015 04:38   Ct Head Wo Contrast  Result Date: 11/18/2015 CLINICAL DATA:  Found lethargic at gas station EXAM: CT HEAD WITHOUT CONTRAST TECHNIQUE: Contiguous axial images were obtained from the base of the skull through the vertex without intravenous contrast. COMPARISON:  Head CT 10/02/2015 FINDINGS: Brain: There is no mass lesion, intraparenchymal hemorrhage or extra-axial collection. No evidence of acute cortical infarct. No hydrocephalus. Brain parenchyma and CSF-containing  spaces are normal for age. Vascular: No hyperdense vessel or unexpected calcification. Skull: Small right frontal sinus osteoma. Sinuses/Orbits: Frothy secretions in the posterior right ethmoid sinus. Other: Visualized extracranial soft tissues are normal. IMPRESSION: Normal head CT. Electronically Signed   By: Deatra Robinson M.D.   On: 11/18/2015 04:50    Procedures Procedures (including critical care time)  Medications Ordered in ED Medications  ondansetron (ZOFRAN) tablet 4 mg (not administered)  thiamine (VITAMIN B-1) tablet 100 mg (not administered)    Or  thiamine (B-1) injection 100 mg (not administered)  LORazepam (ATIVAN) tablet 0-4 mg (not administered)    Followed by  LORazepam (ATIVAN) tablet 0-4 mg (not administered)  folic acid (FOLVITE) tablet 1 mg (not administered)  multivitamin with minerals tablet 1 tablet (not administered)  naloxone  (NARCAN) injection 1 mg (1 mg Intravenous Given 11/18/15 0533)  ondansetron (ZOFRAN) injection 4 mg (4 mg Intravenous Given 11/18/15 0455)  famotidine (PEPCID) IVPB 20 mg premix (0 mg Intravenous Stopped 11/18/15 0525)  sodium chloride 0.9 % bolus 1,000 mL (0 mLs Intravenous Stopped 11/18/15 1118)     Initial Impression / Assessment and Plan / ED Course  I have reviewed the triage vital signs and the nursing notes.  Pertinent labs & imaging results that were available during my care of the patient were reviewed by me and considered in my medical decision making (see chart for details).  Clinical Course    Sign out from [place], [place]  Patient found minimally responsive outside of gas station this morning. Patient with history of alcohol and crack cocaine abuse. Patient given Narcan and fluids. Negative head CT and chest x-ray. Repeat lactic acid at 0730. Observe and reassess when sober.  On reassessment, it is clear that patient needs TTS consult. TTS consult placed. Patient medically cleared. Patient complains of lower abdominal pain for 1 month, low suspicion of acute process. Patient will be transferred to [facility] for further evaluation and treatment. CIWA protocol ordered. Patient also evaluated by Dr. Madilyn Hook, who guided the patient's management and agrees with plan.  Final Clinical Impressions(s) / ED Diagnoses   Final diagnoses:  Alcohol intoxication, with unspecified complication (HCC)  Hallucinations    New Prescriptions New Prescriptions   No medications on file     Emi Holes, Cordelia Poche 11/18/15 1259    Tilden Fossa, MD 11/19/15 [phone]

## 2015-11-18 NOTE — ED Notes (Signed)
Security cleared. Belonging bag x 2 with security. Transferred by security to SAPU. Room #38

## 2015-11-18 NOTE — ED Notes (Signed)
ED PA at bedside

## 2015-11-18 NOTE — ED Notes (Signed)
#   18 REMOVED FORM RAC AND #18 REMOVED FROM LAC..Marland Kitchen

## 2015-11-18 NOTE — ED Notes (Signed)
No noticeable change in pt LOC post Narcan injection

## 2015-11-18 NOTE — ED Notes (Signed)
[facility]

## 2015-11-18 NOTE — ED Notes (Addendum)
Pt remains sleepy. Easy to arouse with verbal and touch. Pt will follow commands, orient x 4, spontaneous movement. Pt currently on rt side.

## 2015-11-18 NOTE — ED Notes (Addendum)
Patient noted sleeping in room. No complaints, stable, in no acute distress. Q15 minute rounds and monitoring via Security Cameras to continue.  

## 2015-11-18 NOTE — ED Notes (Signed)
MD at bedside.EDP REES PRESENT 

## 2015-11-18 NOTE — ED Notes (Signed)
MD at bedside.TTS PRESENT 

## 2015-11-18 NOTE — Progress Notes (Signed)
[facility]:  [address], [place], [place] [address] [phone]

## 2015-11-18 NOTE — ED Provider Notes (Signed)
[facility]PT Provider Note   CSN: [id number] Arrival date & time: 11/18/15  0359     History   Chief Complaint Chief Complaint  Patient presents with  . Altered Mental Status   Level V caveat due to altered mental status HPI Robert Hurley is a 45 y.o. male.He is brought in by EMS for altered mental status. His past medical history of alcohol and cocaine abuse. Patient was apparently found outside of a gas station and was unresponsive to attempts to arouse him. EMS brought him in. They report that he has been protecting his airway but unresponsive to sternal rubs. No Narcan given.  HPI  History reviewed. No pertinent past medical history.  Patient Active Problem List   Diagnosis Date Noted  . Syncope 09/24/2015  . Alcohol withdrawal (HCC) 09/23/2015  . Altered mental status 09/23/2015    History reviewed. No pertinent surgical history.     Home Medications    Prior to Admission medications   Medication Sig Start Date End Date Taking? Authorizing Provider  LORazepam (ATIVAN) 1 MG tablet Take 1mg  3 times a day for 1 day,Take 1mg  twice a day for 1 day, take 1 mg daily for 1 day. then stop. 09/25/15   Rolly Salter, MD  Multiple Vitamin (MULTIVITAMIN WITH MINERALS) TABS tablet Take 1 tablet by mouth daily. 09/25/15   Rolly Salter, MD  thiamine 100 MG tablet Take 1 tablet (100 mg total) by mouth daily. 09/25/15   Rolly Salter, MD    Family History History reviewed. No pertinent family history.  Social History Social History  Substance Use Topics  . Smoking status: Current Every Day Smoker    Types: Cigarettes  . Smokeless tobacco: Not on file  . Alcohol use Not on file     Allergies   Review of patient's allergies indicates no known allergies.   Review of Systems Review of Systems  Unable to review systems due to AMS Physical Exam Updated Vital Signs There were no vitals taken for this visit.  Physical Exam  Constitutional: He appears  well-developed and well-nourished. He appears lethargic. No distress.  Unkempt  HENT:  Head: Normocephalic and atraumatic.  Eyes: EOM are normal. Pupils are equal, round, and reactive to light.  DISCONJUGATE GAZE, PINPOINT PUPILS, INJECTED  CONJUNCTIVAE  Neck: Normal range of motion. No JVD present.  Cardiovascular: Normal rate and regular rhythm.   Pulmonary/Chest: Effort normal and breath sounds normal.  Abdominal: Soft. Bowel sounds are normal. He exhibits no distension. There is tenderness.  Musculoskeletal: Normal range of motion.  Neurological: He appears lethargic. GCS eye subscore is 3. GCS verbal subscore is 3. GCS motor subscore is 5.  Skin: He is not diaphoretic.  Nursing note and vitals reviewed.    ED Treatments / Results  Labs (all labs ordered are listed, but only abnormal results are displayed) Labs Reviewed  CBC WITH DIFFERENTIAL/PLATELET  COMPREHENSIVE METABOLIC PANEL  URINALYSIS, ROUTINE W REFLEX MICROSCOPIC (NOT AT [facility])  AMMONIA  URINE RAPID DRUG SCREEN, HOSP PERFORMED  SALICYLATE LEVEL  CBG MONITORING, ED  I-STAT CG4 LACTIC ACID, ED    EKG  EKG Interpretation  Date/Time:  Friday November 18 2015 04:06:08 EDT Ventricular Rate:  101 PR Interval:    QRS Duration: 86 QT Interval:  370 QTC Calculation: 480 R Axis:   71 Text Interpretation:  Sinus tachycardia Borderline prolonged QT interval No previous ECGs available Confirmed by MOLPUS  MD, Jonny Ruiz ([id number]) on 11/18/2015 4:16:07 AM  Radiology No results found.  Procedures Procedures (including critical care time)  Medications Ordered in ED Medications  naloxone (NARCAN) injection 1 mg (not administered)  ondansetron (ZOFRAN) injection 4 mg (not administered)  famotidine (PEPCID) IVPB 20 mg premix (not administered)     Initial Impression / Assessment and Plan / ED Course  I have reviewed the triage vital signs and the nursing notes.  Pertinent labs & imaging results that were available  during my care of the patient were reviewed by me and considered in my medical decision making (see chart for details).  Clinical Course    Patient lab work, work up in progress. I have given sign out to PA Glenford BayleyAlex LAw who will assume care. Pt stable in ED with no significant deterioration in condition.   Final Clinical Impressions(s) / ED Diagnoses   Final diagnoses:  None    New Prescriptions New Prescriptions   No medications on file     Arthor Captainbigail Manna Gose, PA-C 11/19/15 [phone]    Paula LibraJohn Molpus, MD 11/20/15 2250

## 2015-11-18 NOTE — ED Notes (Signed)
Condom cath applied for pt urine sample.

## 2015-11-18 NOTE — ED Notes (Addendum)
SECURITY CALLED FOR CLEARANCE BEFORE TRANSFER. DELAY RELATED TO SITUATION IN [facility]

## 2015-11-18 NOTE — ED Triage Notes (Signed)
Pt BIB [facility] from the parking lot of a gas station. Per EMS pt unable to stand up on his own or follow commands. Pt increasingly lethargic. Pin point pupils. Unable to answer questions. Pt only stated to EMS that he has been drinking.

## 2015-11-18 NOTE — BH Assessment (Signed)
[facility] Assessment Progress Note  Case was staffed with [name] who recommended patient be monitored and re-evaluated in the a.m.

## 2015-11-19 ENCOUNTER — Inpatient Hospital Stay (HOSPITAL_COMMUNITY)
Admission: AD | Admit: 2015-11-19 | Discharge: 2015-11-28 | DRG: 885 | Disposition: A | Discharge status: Home / Self Care | Payer: Medicare PPO | Source: Intra-hospital | Attending: Psychiatry | Admitting: Psychiatry

## 2015-11-19 ENCOUNTER — Encounter (HOSPITAL_COMMUNITY): Payer: Self-pay

## 2015-11-19 ENCOUNTER — Other Ambulatory Visit: Payer: Self-pay

## 2015-11-19 DIAGNOSIS — G4700 Insomnia, unspecified: Secondary | ICD-10-CM | POA: Diagnosis present

## 2015-11-19 DIAGNOSIS — F1410 Cocaine abuse, uncomplicated: Secondary | ICD-10-CM | POA: Clinically undetermined

## 2015-11-19 DIAGNOSIS — F10120 Alcohol abuse with intoxication, uncomplicated: Secondary | ICD-10-CM | POA: Diagnosis not present

## 2015-11-19 DIAGNOSIS — Z79899 Other long term (current) drug therapy: Secondary | ICD-10-CM | POA: Diagnosis not present

## 2015-11-19 DIAGNOSIS — F10129 Alcohol abuse with intoxication, unspecified: Secondary | ICD-10-CM

## 2015-11-19 DIAGNOSIS — F10229 Alcohol dependence with intoxication, unspecified: Secondary | ICD-10-CM | POA: Diagnosis present

## 2015-11-19 DIAGNOSIS — F419 Anxiety disorder, unspecified: Secondary | ICD-10-CM | POA: Diagnosis present

## 2015-11-19 DIAGNOSIS — F10239 Alcohol dependence with withdrawal, unspecified: Secondary | ICD-10-CM | POA: Diagnosis not present

## 2015-11-19 DIAGNOSIS — F333 Major depressive disorder, recurrent, severe with psychotic symptoms: Secondary | ICD-10-CM | POA: Diagnosis present

## 2015-11-19 DIAGNOSIS — F17210 Nicotine dependence, cigarettes, uncomplicated: Secondary | ICD-10-CM | POA: Diagnosis present

## 2015-11-19 DIAGNOSIS — F1020 Alcohol dependence, uncomplicated: Secondary | ICD-10-CM | POA: Diagnosis not present

## 2015-11-19 DIAGNOSIS — F10929 Alcohol use, unspecified with intoxication, unspecified: Secondary | ICD-10-CM | POA: Diagnosis present

## 2015-11-19 DIAGNOSIS — F329 Major depressive disorder, single episode, unspecified: Secondary | ICD-10-CM | POA: Diagnosis present

## 2015-11-19 LAB — TROPONIN I: Troponin I: <0.03 ng/mL (ref <0.03)

## 2015-11-19 MED ORDER — LORAZEPAM 1 MG PO TABS
0.0000 mg | ORAL_TABLET | Freq: Four times a day (QID) | ORAL | Status: DC
  Administered 2015-11-19 – 2015-11-20 (×2): 1 mg via ORAL
  Filled 2015-11-19 (×2): qty 1

## 2015-11-19 MED ORDER — CHLORDIAZEPOXIDE HCL 25 MG PO CAPS
25.0000 mg | ORAL_CAPSULE | Freq: Three times a day (TID) | ORAL | Status: AC
  Administered 2015-11-21 (×3): 25 mg via ORAL
  Filled 2015-11-19 (×3): qty 1

## 2015-11-19 MED ORDER — ALUM & MAG HYDROXIDE-SIMETH 200-200-20 MG/5ML PO SUSP
30.0000 mL | ORAL | Status: DC | PRN
  Administered 2015-11-24: 30 mL via ORAL
  Filled 2015-11-19: qty 30

## 2015-11-19 MED ORDER — ONDANSETRON 4 MG PO TBDP: 4.0000 mg | ORAL_TABLET | Freq: Four times a day (QID) | ORAL | Status: AC | PRN

## 2015-11-19 MED ORDER — CHLORDIAZEPOXIDE HCL 25 MG PO CAPS
25.0000 mg | ORAL_CAPSULE | ORAL | Status: AC
  Administered 2015-11-22: 25 mg via ORAL
  Filled 2015-11-19: qty 1

## 2015-11-19 MED ORDER — TRAZODONE HCL 50 MG PO TABS
50.0000 mg | ORAL_TABLET | Freq: Every evening | ORAL | Status: DC | PRN
  Administered 2015-11-19 – 2015-11-23 (×2): 50 mg via ORAL
  Filled 2015-11-19 (×3): qty 1

## 2015-11-19 MED ORDER — VITAMIN B-1 100 MG PO TABS
100.0000 mg | ORAL_TABLET | Freq: Every day | ORAL | Status: DC
  Administered 2015-11-20: 100 mg via ORAL
  Filled 2015-11-19: qty 1

## 2015-11-19 MED ORDER — IBUPROFEN 800 MG PO TABS
800.0000 mg | ORAL_TABLET | Freq: Once | ORAL | Status: AC
  Administered 2015-11-19: 800 mg via ORAL
  Filled 2015-11-19: qty 1

## 2015-11-19 MED ORDER — HYDROXYZINE HCL 25 MG PO TABS
25.0000 mg | ORAL_TABLET | Freq: Four times a day (QID) | ORAL | Status: AC | PRN
  Administered 2015-11-19: 25 mg via ORAL
  Filled 2015-11-19: qty 1

## 2015-11-19 MED ORDER — CHLORDIAZEPOXIDE HCL 25 MG PO CAPS
25.0000 mg | ORAL_CAPSULE | Freq: Once | ORAL | Status: AC
  Administered 2015-11-19: 25 mg via ORAL
  Filled 2015-11-19: qty 1

## 2015-11-19 MED ORDER — CHLORDIAZEPOXIDE HCL 25 MG PO CAPS
25.0000 mg | ORAL_CAPSULE | Freq: Four times a day (QID) | ORAL | Status: AC
  Administered 2015-11-19 – 2015-11-20 (×4): 25 mg via ORAL
  Filled 2015-11-19 (×4): qty 1

## 2015-11-19 MED ORDER — CHLORDIAZEPOXIDE HCL 25 MG PO CAPS: 25.0000 mg | ORAL_CAPSULE | Freq: Four times a day (QID) | ORAL | Status: DC | PRN

## 2015-11-19 MED ORDER — LOPERAMIDE HCL 2 MG PO CAPS: 2.0000 mg | ORAL_CAPSULE | ORAL | Status: AC | PRN

## 2015-11-19 MED ORDER — NICOTINE 21 MG/24HR TD PT24
21.0000 mg | MEDICATED_PATCH | Freq: Every day | TRANSDERMAL | Status: DC
  Administered 2015-11-19 – 2015-11-24 (×5): 21 mg via TRANSDERMAL
  Filled 2015-11-19 (×14): qty 1

## 2015-11-19 MED ORDER — THIAMINE HCL 100 MG/ML IJ SOLN: 100.0000 mg | Freq: Once | INTRAMUSCULAR | Status: DC

## 2015-11-19 MED ORDER — ADULT MULTIVITAMIN W/MINERALS CH
1.0000 | ORAL_TABLET | Freq: Every day | ORAL | Status: DC
  Administered 2015-11-19 – 2015-11-20 (×2): 1 via ORAL
  Filled 2015-11-19 (×5): qty 1

## 2015-11-19 MED ORDER — MAGNESIUM HYDROXIDE 400 MG/5ML PO SUSP: 30.0000 mL | Freq: Every day | ORAL | Status: DC | PRN

## 2015-11-19 MED ORDER — CHLORDIAZEPOXIDE HCL 25 MG PO CAPS
25.0000 mg | ORAL_CAPSULE | Freq: Every day | ORAL | Status: AC
  Administered 2015-11-23: 25 mg via ORAL

## 2015-11-19 MED ORDER — LORAZEPAM 1 MG PO TABS: 0.0000 mg | ORAL_TABLET | Freq: Two times a day (BID) | ORAL | Status: DC

## 2015-11-19 MED ORDER — ACETAMINOPHEN 325 MG PO TABS
650.0000 mg | ORAL_TABLET | Freq: Four times a day (QID) | ORAL | Status: DC | PRN
  Administered 2015-11-23 (×2): 650 mg via ORAL
  Filled 2015-11-19 (×2): qty 2

## 2015-11-19 MED ORDER — THIAMINE HCL 100 MG/ML IJ SOLN: 100.0000 mg | Freq: Every day | INTRAMUSCULAR | Status: DC

## 2015-11-19 NOTE — ED Notes (Signed)
Patient noted sleeping in room. No complaints, stable, in no acute distress. Q15 minute rounds and monitoring via Security Cameras to continue.  

## 2015-11-19 NOTE — ED Notes (Signed)
Up to the bathroom 

## 2015-11-19 NOTE — Tx Team (Signed)
Initial Treatment Plan 11/19/2015 5:27 PM Karn PicklerMichael E Alexie ONG:[id number]    PATIENT STRESSORS: Health problems Medication change or noncompliance Substance abuse   PATIENT STRENGTHS: Licensed conveyancerCommunication skills Financial means   PATIENT IDENTIFIED PROBLEMS: Depression  Anxiety  Substance abuse  "Long term outpatient, like [facility]"  "Getting married"             DISCHARGE CRITERIA:  Improved stabilization in mood, thinking, and/or behavior Verbal commitment to aftercare and medication compliance Withdrawal symptoms are absent or subacute and managed without 24-hour nursing intervention  PRELIMINARY DISCHARGE PLAN: Outpatient therapy Medication management  PATIENT/FAMILY INVOLVEMENT: This treatment plan has been presented to and reviewed with the patient, Karn PicklerMichael E Domke.  The patient and family have been given the opportunity to ask questions and make suggestions.  Levin BaconHeather V Beckie Viscardi, RN 11/19/2015, 5:27 PM

## 2015-11-19 NOTE — ED Notes (Signed)
Dr Clarene DukeLittle updated ZO:XWRUEre:chest pain
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
## 2015-11-19 NOTE — ED Notes (Addendum)
EKG completed and given to dr Clarene DukeLittle, troponin drawn
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
## 2015-11-19 NOTE — ED Notes (Signed)
Patient denies pain and is resting comfortably.  

## 2015-11-19 NOTE — ED Notes (Addendum)
Pt ambulatory w/o difficulty to [facility] with [place], belongings given to driver. 

## 2015-11-20 DIAGNOSIS — Z79899 Other long term (current) drug therapy: Secondary | ICD-10-CM

## 2015-11-20 DIAGNOSIS — F1020 Alcohol dependence, uncomplicated: Secondary | ICD-10-CM

## 2015-11-20 DIAGNOSIS — F17210 Nicotine dependence, cigarettes, uncomplicated: Secondary | ICD-10-CM

## 2015-11-20 MED ORDER — LORAZEPAM 2 MG/ML IJ SOLN: 1.0000 mg | Freq: Four times a day (QID) | INTRAMUSCULAR | Status: DC | PRN

## 2015-11-20 MED ORDER — ADULT MULTIVITAMIN W/MINERALS CH
1.0000 | ORAL_TABLET | Freq: Every day | ORAL | Status: DC
  Administered 2015-11-21 – 2015-11-28 (×8): 1 via ORAL
  Filled 2015-11-20 (×10): qty 1

## 2015-11-20 MED ORDER — LORAZEPAM 1 MG PO TABS
1.0000 mg | ORAL_TABLET | Freq: Four times a day (QID) | ORAL | Status: DC | PRN
  Administered 2015-11-20 – 2015-11-21 (×2): 1 mg via ORAL
  Filled 2015-11-20 (×2): qty 1

## 2015-11-20 NOTE — [facility] Group Notes (Signed)
Nursing psychoeducational group focussed on mindfulness meditation/body scanning technique with guided meditation.   Pt did not attend. 
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
## 2015-11-20 NOTE — Progress Notes (Signed)
Pt requested to go into room with piano. Asked how he was feeling, responded "like an outcast".

## 2015-11-20 NOTE — [facility] Group Notes (Signed)
[facility] LCSW Group Therapy  11/20/2015 10:00 AM  Type of Therapy:  Group Therapy  Participation Level:  Did Not Attend  Robert Hurley 11/20/2015, 12:22 PM

## 2015-11-20 NOTE — [facility] Suicide Risk Assessment (Signed)
[facility] Admission Suicide Risk Assessment   Nursing information obtained from:  Patient Demographic factors:  Male, Living alone Current Mental Status:  NA Loss Factors:  Loss of significant relationship Historical Factors:  NA Risk Reduction Factors:  NA  Total Time spent with patient: 20 minutes Principal Problem: Alcohol use disorder, severe, dependence (HCC) Diagnosis:   Patient Active Problem List   Diagnosis Date Noted  . Alcohol use disorder, severe, dependence (HCC) [F10.20] 11/20/2015    Class: Acute  . MDD (major depressive disorder) (HCC) [F32.9] 11/19/2015  . Alcohol intoxication (HCC) [F10.129]   . Syncope [R55] 09/24/2015  . Alcohol withdrawal (HCC) [F10.239] 09/23/2015  . Altered mental status [R41.82] 09/23/2015   Subjective Data: Patient is a 45 year old African-American male who came in with alcohol intoxication.  Continued Clinical Symptoms:  Alcohol Use Disorder Identification Test Final Score (AUDIT): 34 The "Alcohol Use Disorders Identification Test", Guidelines for Use in Primary Care, Second Edition.  World Science writer [facility]). Score between 0-7:  no or low risk or alcohol related problems. Score between 8-15:  moderate risk of alcohol related problems. Score between 16-19:  high risk of alcohol related problems. Score 20 or above:  warrants further diagnostic evaluation for alcohol dependence and treatment.   CLINICAL FACTORS:   Depression:   Comorbid alcohol abuse/dependence   Musculoskeletal: Strength & Muscle Tone: within normal limits Gait & Station: broad based Patient leans: N/A  Psychiatric Specialty Exam: Physical Exam  ROS  Blood pressure 113/75, pulse (!) 102, temperature 98.9 F (37.2 C), temperature source Oral, resp. rate 17, height 5\' 8"  (1.727 m), weight 176 lb (79.8 kg), SpO2 100 %.Body mass index is 26.76 kg/m.  General Appearance: Disheveled, sluggish,   Eye Contact:  Poor  Speech:  Slurred, Not spontaneous.  Volume:   Decreased  Mood:  Depressed and Hopeless  Affect:  Restricted  Thought Process:  Descriptions of Associations: Tangential, incoherent  Orientation:  Other:  Oriented to name only  Thought Content:  Tangential  Suicidal Thoughts:  Unable to assess, patient is some what confused, disoriented x 2  Homicidal Thoughts:  Unable to assess, patient is some what confused, disoriented x 2  Memory:  Immediate;   Poor Recent;   Poor Remote;   Poor  Judgement:  Impaired  Insight:  Not present due to current mental status  Psychomotor Activity:  Decreased  Concentration:  Concentration: Poor and Attention Span: Poor  Recall:  Poor  Fund of Knowledge:  Poor  Language:  Poor  Akathisia:  Negative  Handed:  Right  AIMS (if indicated):     Assets:  Desire for Improvement  ADL's:  Impaired  Cognition:  Impaired,  Moderate  Sleep:  Number of Hours: 5.75         COGNITIVE FEATURES THAT CONTRIBUTE TO RISK:  Closed-mindedness and Thought constriction (tunnel vision)    SUICIDE RISK:   Moderate:  Frequent suicidal ideation with limited intensity, and duration, some specificity in terms of plans, no associated intent, good self-control, limited dysphoria/symptomatology, some risk factors present, and identifiable protective factors, including available and accessible social support.   PLAN OF CARE:  Daily contact with patient to assess and evaluate symptoms and progress in treatment and Medication management: 1. Admit for crisis management and stabilization, estimated length of stay 3-5 days.  2. Medication management to reduce current symptoms to base line and improve the patient's overall level of functioning: See MAR 3. Treat health problems as indicated.  4. Develop treatment plan to  decrease risk of relapse upon discharge and the need for readmission.  5. Psycho-social education regarding relapse prevention and self care.  6. Health care follow up as needed for medical problems.  7. Review,  reconcile, and reinstate any pertinent home medications for other health issues where appropriate. 8. Call for consults with hospitalist for any additional specialty patient care services as needed.  Observation Level/Precautions:  15 minute checks  Laboratory:  Per ED, BAL 167  Psychotherapy: Group sessions, AA/NA meetings.   Medications: See MAR  Consultations: As needed  Discharge Concerns: maintaining sobriety  Estimated LOS: 2-4 days.  Other: Admit to [facility].      I certify that inpatient services furnished can reasonably be expected to improve the patient's condition.  Patrick NorthAVI, Requan Hardge, MD 11/20/2015, 12:18 PM

## 2015-11-20 NOTE — H&P (Signed)
Psychiatric Admission Assessment Adult  Patient Identification: Robert Hurley  MRN:  [id number]  Date of Evaluation:  11/20/2015  Chief Complaint: Alcohol intoxications.  Principal Diagnosis: Alcohol use disorder, dependence, severe.  Diagnosis:   Patient Active Problem List   Diagnosis Date Noted  . Alcohol use disorder, severe, dependence (HCC) [F10.20] 11/20/2015    Priority: High    Class: Acute  . MDD (major depressive disorder) (HCC) [F32.9] 11/19/2015  . Alcohol intoxication (HCC) [F10.129]   . Syncope [R55] 09/24/2015  . Alcohol withdrawal (HCC) [F10.239] 09/23/2015  . Altered mental status [R41.82] 09/23/2015   History of Present Illness: This is an admission assessment for Robert Hurley, a 45 year old AA male with hx of chronic alcoholism. Admitted to the [facility] adult unit with complaints of alcohol intoxication requiring detoxification treatments. During this assessment, Hollie sluggishly reports, "Somebody called the ambulance for me the other day. I don't know where I was or what was happening to me. I don't remember getting to the hospital. They told me at the ED that I was drunk. I don't remember getting drunk. I don't think that I have been here before. I know I drink a lot of alcohol (liquor & beer) about a gallon a day. I know that much. I'm depressed. I want a 2 year substance abuse treatment".  Objective: [name] is seen, chart reviewed. He is barely alert. Presents as sluggish, weak & sweating a lot. He appears disheveled & older than stated age. Is verbally responsive to some extent, however, his speech is slurred/garbled. He presents as a poor historian. Is currently being admitted for alcohol detoxification treatment. Will wait for a day or two for his mental clarity, may be, staff will learn a lot more about this case.  Associated Signs/Symptoms:  Depression Symptoms:  depressed mood, fatigue, hopelessness, anxiety,  (Hypo) Manic Symptoms:   Impulsivity,  Anxiety Symptoms:  Alcohol withdrawal related anxiety.  Psychotic Symptoms:  Paranoia,  PTSD Symptoms: Unable to assess, patient is currently a poor historian due to disorientation/confusion.  Total Time spent with patient: 1 hour  Past Psychiatric History: Alcoholism, chronic.  Is the patient at risk to self? Yes.  Due to chronic alcoholism.  Has the patient been a risk to self in the past 6 months? Yes.   : Due to chronic alcoholism. Has the patient been a risk to self within the distant past? Yes.   : Due to chronic alcoholism. Is the patient a risk to others? Yes.   :Due to chronic alcoholism. Has the patient been a risk to others in the past 6 months? Yes.   : Due to chronic alcoholism. Has the patient been a risk to others within the distant past? Yes.    Due to chronic alcoholism  Prior Inpatient Therapy:   Prior Outpatient Therapy:    Alcohol Screening: 1. How often do you have a drink containing alcohol?: 4 or more times a week 2. How many drinks containing alcohol do you have on a typical day when you are drinking?: 10 or more 3. How often do you have six or more drinks on one occasion?: Daily or almost daily Preliminary Score: 8 4. How often during the last year have you found that you were not able to stop drinking once you had started?: Weekly 5. How often during the last year have you failed to do what was normally expected from you becasue of drinking?: Weekly 6. How often during the last year have you needed a first  drink in the morning to get yourself going after a heavy drinking session?: Daily or almost daily 7. How often during the last year have you had a feeling of guilt of remorse after drinking?: Daily or almost daily 8. How often during the last year have you been unable to remember what happened the night before because you had been drinking?: Daily or almost daily 9. Have you or someone else been injured as a result of your drinking?: Yes, but  not in the last year 10. Has a relative or friend or a doctor or another health worker been concerned about your drinking or suggested you cut down?: Yes, but not in the last year Alcohol Use Disorder Identification Test Final Score (AUDIT): 34 Brief Intervention: Yes  Substance Abuse History in the last 12 months:  Yes.    Consequences of Substance Abuse: Medical Consequences:  Liver damage, Possible death by overdose Legal Consequences:  Arrests, jail time, Loss of driving privilege. Family Consequences:  Family discord, divorce and or separation.  Previous Psychotropic Medications: Unsure, patient current is a poor historian due to disorientation.  Psychological Evaluations: Yes   Past Medical History: History reviewed. No pertinent past medical history. History reviewed. No pertinent surgical history.  Family History: History reviewed. No pertinent family history.  Family Psychiatric  History:   Tobacco Screening: Have you used any form of tobacco in the last 30 days? (Cigarettes, Smokeless Tobacco, Cigars, and/or Pipes): Yes Tobacco use, Select all that apply: 5 or more cigarettes per day Are you interested in Tobacco Cessation Medications?: Yes, will notify MD for an order Counseled patient on smoking cessation including recognizing danger situations, developing coping skills and basic information about quitting provided: Refused/Declined practical counseling  Social History:  History  Alcohol use Not on file    Comment: 14 gallons per week (2 gallons per day)     History  Drug Use    Additional Social History: Pain Medications: See MAR Prescriptions: See MAR Over the Counter: See MAR History of alcohol / drug use?: Yes Longest period of sobriety (when/how long): 5 years Negative Consequences of Use: Financial, Personal relationships Withdrawal Symptoms: Tremors, Other (Comment) (headaches) Name of Substance 1: Alcohol 1 - Age of First Use: 45 1 - Amount (size/oz):  2 gallons per day 1 - Frequency: daily 1 - Duration: Last two years 1 - Last Use / Amount: 11/18/15 pt reports consuming 1/5 th of liquor  Allergies:   Allergies  Allergen Reactions  . Lamictal [Lamotrigine]     Hives-skin burning  . Seroquel [Quetiapine Fumarate]     Hives-skin burning   Lab Results:  Results for orders placed or performed during the hospital encounter of 11/18/15 (from the past 48 hour(s))  Troponin I     Status: None   Collection Time: 11/19/15 11:30 AM  Result Value Ref Range   Troponin I <0.03 <0.03 ng/mL   Blood Alcohol level:  Lab Results  Component Value Date   ETH 167 (H) 11/18/2015   ETH 258 (H) 09/23/2015   Metabolic Disorder Labs:  No results found for: HGBA1C, MPG No results found for: PROLACTIN No results found for: CHOL, TRIG, HDL, CHOLHDL, VLDL, LDLCALC  Current Medications: Current Facility-Administered Medications  Medication Dose Route Frequency Provider Last Rate Last Dose  . acetaminophen (TYLENOL) tablet 650 mg  650 mg Oral Q6H PRN Shuvon B Rankin, NP      . alum & mag hydroxide-simeth (MAALOX/MYLANTA) 200-200-20 MG/5ML suspension 30 mL  30 mL  Oral Q4H PRN Shuvon B Rankin, NP      . chlordiazePOXIDE (LIBRIUM) capsule 25 mg  25 mg Oral Q6H PRN Oneta Rackanika N Lewis, NP      . chlordiazePOXIDE (LIBRIUM) capsule 25 mg  25 mg Oral QID Oneta Rackanika N Lewis, NP   25 mg at 11/20/15 0826   Followed by  . [START ON 11/21/2015] chlordiazePOXIDE (LIBRIUM) capsule 25 mg  25 mg Oral TID Oneta Rackanika N Lewis, NP       Followed by  . [START ON 11/22/2015] chlordiazePOXIDE (LIBRIUM) capsule 25 mg  25 mg Oral BH-qamhs Oneta Rackanika N Lewis, NP       Followed by  . [START ON 11/23/2015] chlordiazePOXIDE (LIBRIUM) capsule 25 mg  25 mg Oral Daily Oneta Rackanika N Lewis, NP      . hydrOXYzine (ATARAX/VISTARIL) tablet 25 mg  25 mg Oral Q6H PRN Oneta Rackanika N Lewis, NP   25 mg at 11/19/15 2111  . loperamide (IMODIUM) capsule 2-4 mg  2-4 mg Oral PRN Oneta Rackanika N Lewis, NP      . LORazepam (ATIVAN) tablet  0-4 mg  0-4 mg Oral Q6H Shuvon B Rankin, NP   1 mg at 11/20/15 16100621   Followed by  . LORazepam (ATIVAN) tablet 0-4 mg  0-4 mg Oral Q12H Shuvon B Rankin, NP      . magnesium hydroxide (MILK OF MAGNESIA) suspension 30 mL  30 mL Oral Daily PRN Shuvon B Rankin, NP      . multivitamin with minerals tablet 1 tablet  1 tablet Oral Daily Oneta Rackanika N Lewis, NP   1 tablet at 11/20/15 0826  . nicotine (NICODERM CQ - dosed in mg/24 hours) patch 21 mg  21 mg Transdermal Daily Oneta Rackanika N Lewis, NP   21 mg at 11/19/15 1828  . ondansetron (ZOFRAN-ODT) disintegrating tablet 4 mg  4 mg Oral Q6H PRN Oneta Rackanika N Lewis, NP      . thiamine (B-1) injection 100 mg  100 mg Intravenous Daily Shuvon B Rankin, NP      . thiamine (B-1) injection 100 mg  100 mg Intramuscular Once Oneta Rackanika N Lewis, NP      . traZODone (DESYREL) tablet 50 mg  50 mg Oral QHS PRN Kristeen MansFran E Hobson, NP   50 mg at 11/19/15 2222   PTA Medications: Prescriptions Prior to Admission  Medication Sig Dispense Refill Last Dose  . LORazepam (ATIVAN) 1 MG tablet Take 1mg  3 times a day for 1 day,Take 1mg  twice a day for 1 day, take 1 mg daily for 1 day. then stop. (Patient not taking: Reported on 11/18/2015) 6 tablet 0 Completed Course at Unknown time  . Multiple Vitamin (MULTIVITAMIN WITH MINERALS) TABS tablet Take 1 tablet by mouth daily. (Patient not taking: Reported on 11/18/2015) 30 tablet 0 Not Taking at Unknown time  . thiamine 100 MG tablet Take 1 tablet (100 mg total) by mouth daily. (Patient not taking: Reported on 11/18/2015) 30 tablet 0 Not Taking at Unknown time   Musculoskeletal: Strength & Muscle Tone: within normal limits Gait & Station: normal Patient leans: N/A  Psychiatric Specialty Exam: Physical Exam  Constitutional: He appears well-developed.  HENT:  Head: Normocephalic.  Eyes: Pupils are equal, round, and reactive to light.  Neck: Normal range of motion.  Cardiovascular:  Elevated diastolic blood pressure.  Respiratory: Effort normal.  GI:  Soft.  Genitourinary:  Genitourinary Comments: Denies any issues in this area  Musculoskeletal: Normal range of motion.  Neurological: He is alert.  Skin: Skin is  warm.    Review of Systems  Constitutional: Positive for chills, diaphoresis and malaise/fatigue.  HENT: Negative.   Eyes: Positive for blurred vision.  Respiratory: Negative.   Cardiovascular: Negative.        Elevated diastolic blood pressure  Musculoskeletal: Positive for myalgias.  Skin: Negative.   Neurological: Positive for dizziness.  Endo/Heme/Allergies: Negative.   Psychiatric/Behavioral: Positive for depression and substance abuse (Alcoholism, chronic). Negative for hallucinations, memory loss and suicidal ideas. The patient is nervous/anxious and has insomnia.     Blood pressure (!) 126/92, pulse 92, temperature 98.9 F (37.2 C), temperature source Oral, resp. rate 17, height 5\' 8"  (1.727 m), weight 79.8 kg (176 lb), SpO2 100 %.Body mass index is 26.76 kg/m.  General Appearance: Disheveled, sluggish,   Eye Contact:  Poor  Speech:  Slurred, Not spontaneous.  Volume:  Decreased  Mood:  Depressed and Hopeless  Affect:  Restricted  Thought Process:  Descriptions of Associations: Tangential, incoherent  Orientation:  Other:  Oriented to name only  Thought Content:  Tangential  Suicidal Thoughts:  Unable to assess, patient is some what confused, disoriented x 2  Homicidal Thoughts:  Unable to assess, patient is some what confused, disoriented x 2  Memory:  Immediate;   Poor Recent;   Poor Remote;   Poor  Judgement:  Impaired  Insight:  Not present due to current mental status  Psychomotor Activity:  Decreased  Concentration:  Concentration: Poor and Attention Span: Poor  Recall:  Poor  Fund of Knowledge:  Poor  Language:  Poor  Akathisia:  Negative  Handed:  Right  AIMS (if indicated):     Assets:  Desire for Improvement  ADL's:  Impaired  Cognition:  Impaired,  Moderate  Sleep:  Number of Hours: 5.75    Treatment Plan Summary: Daily contact with patient to assess and evaluate symptoms and progress in treatment and Medication management: 1. Admit for crisis management and stabilization, estimated length of stay 3-5 days.  2. Medication management to reduce current symptoms to base line and improve the patient's overall level of functioning: See MAR 3. Treat health problems as indicated.  4. Develop treatment plan to decrease risk of relapse upon discharge and the need for readmission.  5. Psycho-social education regarding relapse prevention and self care.  6. Health care follow up as needed for medical problems.  7. Review, reconcile, and reinstate any pertinent home medications for other health issues where appropriate. 8. Call for consults with hospitalist for any additional specialty patient care services as needed.  Observation Level/Precautions:  15 minute checks  Laboratory:  Per ED, BAL 167  Psychotherapy: Group sessions, AA/NA meetings.   Medications: See MAR  Consultations: As needed  Discharge Concerns: maintaining sobriety  Estimated LOS: 2-4 days.  Other: Admit to 300-Hall.    Physician Treatment Plan for Primary Diagnosis: Alcohol use disorder, dependence, sever.   Long Term Goal(s): Improvement in symptoms so as ready for discharge  Short Term Goals: Ability to identify changes in lifestyle to reduce recurrence of condition will improve, Ability to verbalize feelings will improve, Ability to demonstrate self-control will improve and Ability to identify triggers associated with substance abuse/mental health issues will improve  Physician Treatment Plan for Secondary Diagnosis: Principal Problem:   Alcohol use disorder, severe, dependence (HCC) Active Problems:   MDD (major depressive disorder) (HCC)  I certify that inpatient services furnished can reasonably be expected to improve the patient's condition.    Sanjuana Kava, NP, PMHNP, FNP-BC 9/3/201710:18  AM

## 2015-11-20 NOTE — BH Assessment (Signed)
DAR Note: Patient was met sitting on day room 500 unit. No interaction. Patient was sent to 500 unit after altercation with some peers. Per report: peers claimed to hear patient talking about how he kills and eats people etc.  Patient later became worried and a little agitated over his stay in 500. Patient told this writer "I am not going to accept staying here . I don't belong here. Why did they brought me over here instead of the other guy. I am not going to take it". Patient explained to patient why he was brought over to 500 and will go back to 300 at bed time. Patient calmed down. Accepted his medications. Denies pain, SI, AH/VH. CIWA 3 and vital signs WDL. Patient was later transferred to 300. Patient reassure this writer stated "I am not here to make any problems or fight people". Patient requested some food. Sandwich and drinks served. Patient took shower and went to bed. Sleeping at this time. Patient encouraged to verbalize needs/concerns to staff and

## 2015-11-21 ENCOUNTER — Encounter (HOSPITAL_COMMUNITY): Payer: Self-pay | Admitting: Psychiatry

## 2015-11-21 DIAGNOSIS — F333 Major depressive disorder, recurrent, severe with psychotic symptoms: Secondary | ICD-10-CM | POA: Diagnosis present

## 2015-11-21 DIAGNOSIS — F1410 Cocaine abuse, uncomplicated: Secondary | ICD-10-CM | POA: Clinically undetermined

## 2015-11-21 MED ORDER — DIPHENHYDRAMINE HCL 50 MG/ML IJ SOLN: 25.0000 mg | Freq: Three times a day (TID) | INTRAMUSCULAR | Status: DC | PRN

## 2015-11-21 MED ORDER — DIPHENHYDRAMINE HCL 25 MG PO CAPS: 25.0000 mg | ORAL_CAPSULE | Freq: Three times a day (TID) | ORAL | Status: DC | PRN

## 2015-11-21 MED ORDER — CHLORDIAZEPOXIDE HCL 25 MG PO CAPS
25.0000 mg | ORAL_CAPSULE | Freq: Four times a day (QID) | ORAL | Status: DC | PRN
  Administered 2015-11-24: 25 mg via ORAL
  Filled 2015-11-21 (×2): qty 1

## 2015-11-21 MED ORDER — ARIPIPRAZOLE 5 MG PO TABS
5.0000 mg | ORAL_TABLET | Freq: Every day | ORAL | Status: DC
  Filled 2015-11-21 (×3): qty 1

## 2015-11-21 MED ORDER — HALOPERIDOL 5 MG PO TABS: 5.0000 mg | ORAL_TABLET | Freq: Three times a day (TID) | ORAL | Status: DC | PRN

## 2015-11-21 MED ORDER — DIPHENHYDRAMINE HCL 50 MG/ML IJ SOLN: 25.0000 mg | Freq: Four times a day (QID) | INTRAMUSCULAR | Status: DC | PRN

## 2015-11-21 MED ORDER — BENZTROPINE MESYLATE 0.5 MG PO TABS
0.5000 mg | ORAL_TABLET | Freq: Every day | ORAL | Status: DC
  Filled 2015-11-21 (×2): qty 1

## 2015-11-21 MED ORDER — SERTRALINE HCL 25 MG PO TABS
25.0000 mg | ORAL_TABLET | Freq: Every day | ORAL | Status: DC
  Administered 2015-11-22 – 2015-11-28 (×7): 25 mg via ORAL
  Filled 2015-11-21 (×9): qty 1

## 2015-11-21 MED ORDER — VITAMIN B-1 100 MG PO TABS
100.0000 mg | ORAL_TABLET | Freq: Every day | ORAL | Status: DC
  Administered 2015-11-21 – 2015-11-28 (×8): 100 mg via ORAL
  Filled 2015-11-21 (×11): qty 1

## 2015-11-21 MED ORDER — HALOPERIDOL LACTATE 5 MG/ML IJ SOLN: 5.0000 mg | Freq: Three times a day (TID) | INTRAMUSCULAR | Status: DC | PRN

## 2015-11-21 NOTE — Progress Notes (Signed)
Recreation Therapy Notes  Date: 11/21/15 Time: 1000 Location: 500 Hall Dayroom  Group Topic: Wellness  Goal Area(s) Addresses:  Patient will define components of whole wellness. Patient will verbalize benefit of whole wellness.  Intervention:  [employer] ball, chairs  Activity: [employer] it [employer] Volleyball.  LRT arranged the chairs in a circle.  Patients were to sit around the circle and pass the ball back and forth as the LRT kept count of the number of times the ball was hit.  The ball could bounce off of the floor but it could not roll to a stop.  If the ball came to a stop, the count would start over.  Education:Wellness, Discharge Planning.   Education Outcome: Needs additional education.   Clinical Observations/Feedback: Pt did not attend group.   Caroll RancherMarjette Markea Ruzich, LRT/CTRS    Caroll RancherLindsay, Zyshonne Malecha A 11/21/2015 11:50 AM

## 2015-11-21 NOTE — Progress Notes (Signed)
Patient did not attend the evening speaker AA meeting. Pt was notified that group was beginning but remained in his room.    

## 2015-11-21 NOTE — Progress Notes (Signed)
Recreation Therapy Notes  INPATIENT RECREATION THERAPY ASSESSMENT  Patient Details Name: Karn PicklerMichael E Trudel MRN: [id number] DOB: 02/17/1971 Today's Date: 11/21/2015  Patient Stressors: Family, Other (Comment) (Confinement)  Coping Skills:   Isolate, Art/Dance  Personal Challenges: Pt did not name any personal challenges.  Leisure Interests (2+):  Ashby Dawesature - Hiking, Citigroupature - Lucent TechnologiesFishing, Technical brewerature - Other (Comment) (Looking at wildlife)  Awareness of Community Resources:  Yes  Community Resources:  Park  Current Use: No  If no, Barriers?: Other (Comment) ("I don't have time")  Patient Strengths:  Comprehension, physical strength  Patient Identified Areas of Improvement:  Temperment, social interaction  Current Recreation Participation:  Everyday  Patient Goal for Hospitalization:  "Learn how to socialize and be able to tolerate others being who they are"  [place]  [place]  Current [place] (including self-harm):  No  Current HI:  No  Consent to Intern Participation: N/A  Caroll RancherMarjette Jaiveon Suppes, LRT/CTRS  Caroll RancherLindsay, Leara Rawl A 11/21/2015, 12:45 PM

## 2015-11-21 NOTE — Plan of Care (Signed)
Problem: Education: Goal: Utilization of techniques to improve thought processes will improve Outcome: Progressing Nurse discussed depression/coping skills with patient.    

## 2015-11-21 NOTE — Progress Notes (Signed)
Patient ID: Robert PicklerMichael E Soots, male   DOB: 1970/05/28, 45 y.o.   MRN: [id number]  Pt currently presents with a flat affect and blunted behavior. Pt reports to writer that their goal is to "talk to the doctor to see if I can get out of here." Pt reports delusions of grandeur and his speech is tangential. Pt states "I want to do art and someone keeps stealing it from me, I have a lot of credits on the outside, they don't want to mess with me or him, in 2028 he will put chips in everyone's heads, they will control you, ya'll can't control me." Pt refuses all medications, aggravated about possibility of getting a roomate. Pt requests an order to go over to 300 hall to play the piano. Pt interacts positively with peers on the unit.   Pt provided with medications per providers orders. Pt's labs and vitals were monitored throughout the night. Pt supported emotionally and encouraged to express concerns and questions. Pt educated on medications. No roommate ordered by PA.   Pt's safety ensured with 15 minute and environmental checks. Unable to assess patient for current SI/HI or AVH. Pt does respond appropriately to emotional encouragement and agrees to let writer know if he changes his mind about medications. Will continue POC.

## 2015-11-21 NOTE — Progress Notes (Signed)
[facility]  11/21/2015 1:40 PM Robert PicklerMichael E Hurley  MRN:  [id number] Subjective: Patient states " I have alcohol problems , I have been using 2 gallons per day. 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 I own several properties all over [place] and I am worth several million $."  Objective:Robert Hurley is a [age] y old single , AAM who lives in [place] , [place], has a hx of alcohol use disorder and depression, [facility] for psychosis/AMS.  Patient seen and chart reviewed.Discussed patient with treatment team.  Patient continues to be delusional , depressed and appears groggy from the medications he is on. Pt with severe alcohol abuse - abuses 2 gallons of alcohol daily - is motivated to get help. Pt today seems to have grandiose delusions and is paranoid - denies any AVH today. Pt per staff continues to be seen as psychotic , needs redirection on the unit.      Principal Problem: MDD (major depressive disorder), recurrent, severe, with psychosis (HCC); R/O Schizophrenia Diagnosis:   Patient Active Problem List   Diagnosis Date Noted  . MDD (major depressive disorder), recurrent, severe, with psychosis (HCC) [F33.3] 11/21/2015  . Alcohol use disorder, severe, dependence (HCC) [F10.20] 11/20/2015    Class: Acute  . Alcohol intoxication (HCC) [F10.129]   . Syncope [R55] 09/24/2015  . Alcohol withdrawal (HCC) [F10.239] 09/23/2015  . Altered mental status [R41.82] 09/23/2015   Total Time spent with patient: 30 minutes  Past Psychiatric History: Please see H&P.   Past Medical History: Please see H&P.  Family History: Please see H&P.  Family Psychiatric  History: Mother's side of family has mental illness, aunts and uncles have alcoholism. Social History: Please see H&P.  History  Alcohol use Not on file    Comment: 14 gallons per week (2 gallons per day)     History  Drug Use    Social History   Social History   . Marital status: Single    Spouse name: N/A  . Number of children: N/A  . Years of education: N/A   Social History Main Topics  . Smoking status: Current Every Day Smoker    Packs/day: 1.00    Types: Cigarettes  . Smokeless tobacco: Never Used  . Alcohol use None     Comment: 14 gallons per week (2 gallons per day)  . Drug use:   . Sexual activity: Not Asked   Other Topics Concern  . None   Social History Narrative  . None   Additional Social History:    Pain Medications: See MAR Prescriptions: See MAR Over the Counter: See MAR History of alcohol / drug use?: Yes Longest period of sobriety (when/how long): 5 years Negative Consequences of Use: Financial, Personal relationships Withdrawal Symptoms: Tremors, Other (Comment) (headaches) Name of Substance 1: Alcohol 1 - Age of First Use: 22 1 - Amount (size/oz): 2 gallons per day 1 - Frequency: daily 1 - Duration: Last two years 1 - Last Use / Amount: 11/18/15 pt reports consuming 1/5 th of liquor                  Sleep: Fair  Appetite:  Fair  Current Medications: Current Facility-Administered Medications  Medication Dose Route Frequency Provider Last Rate Last Dose  . acetaminophen (TYLENOL) tablet 650 mg  650 mg Oral Q6H PRN Shuvon B Rankin, NP      . alum & mag  hydroxide-simeth (MAALOX/MYLANTA) 200-200-20 MG/5ML suspension 30 mL  30 mL Oral Q4H PRN Shuvon B Rankin, NP      . ARIPiprazole (ABILIFY) tablet 5 mg  5 mg Oral QHS Thorn Demas, MD      . benztropine (COGENTIN) tablet 0.5 mg  0.5 mg Oral QHS Kaiyon Hynes, MD      . chlordiazePOXIDE (LIBRIUM) capsule 25 mg  25 mg Oral TID Oneta Rack, NP   25 mg at 11/21/15 1201   Followed by  . [START ON 11/22/2015] chlordiazePOXIDE (LIBRIUM) capsule 25 mg  25 mg Oral BH-qamhs Oneta Rack, NP       Followed by  . [START ON 11/23/2015] chlordiazePOXIDE (LIBRIUM) capsule 25 mg  25 mg Oral Daily Oneta Rack, NP      . chlordiazePOXIDE (LIBRIUM) capsule 25 mg   25 mg Oral QID PRN Jomarie Longs, MD      . hydrOXYzine (ATARAX/VISTARIL) tablet 25 mg  25 mg Oral Q6H PRN Oneta Rack, NP   25 mg at 11/19/15 2111  . loperamide (IMODIUM) capsule 2-4 mg  2-4 mg Oral PRN Oneta Rack, NP      . magnesium hydroxide (MILK OF MAGNESIA) suspension 30 mL  30 mL Oral Daily PRN Shuvon B Rankin, NP      . multivitamin with minerals tablet 1 tablet  1 tablet Oral Daily Sanjuana Kava, NP   1 tablet at 11/21/15 0837  . nicotine (NICODERM CQ - dosed in mg/24 hours) patch 21 mg  21 mg Transdermal Daily Oneta Rack, NP   21 mg at 11/21/15 0839  . ondansetron (ZOFRAN-ODT) disintegrating tablet 4 mg  4 mg Oral Q6H PRN Oneta Rack, NP      . Melene Muller ON 11/22/2015] sertraline (ZOLOFT) tablet 25 mg  25 mg Oral Daily Evany Schecter, MD      . thiamine (VITAMIN B-1) tablet 100 mg  100 mg Oral Daily Craige Cotta, MD   100 mg at 11/21/15 1034  . traZODone (DESYREL) tablet 50 mg  50 mg Oral QHS PRN Kristeen Mans, NP   50 mg at 11/19/15 2222    Lab Results: No results found for this or any previous visit (from the past 48 hour(s)).  Blood Alcohol level:  Lab Results  Component Value Date   ETH 167 (H) 11/18/2015   ETH 258 (H) 09/23/2015    Metabolic Disorder Labs: No results found for: HGBA1C, MPG No results found for: PROLACTIN No results found for: CHOL, TRIG, HDL, CHOLHDL, VLDL, LDLCALC  Physical Findings: AIMS: Facial and Oral Movements Muscles of Facial Expression: None, normal Lips and Perioral Area: None, normal Jaw: None, normal Tongue: None, normal,Extremity Movements Upper (arms, wrists, hands, fingers): None, normal Lower (legs, knees, ankles, toes): None, normal, Trunk Movements Neck, shoulders, hips: None, normal, Overall Severity Severity of abnormal movements (highest score from questions above): None, normal Incapacitation due to abnormal movements: None, normal Patient's awareness of abnormal movements (rate only patient's report): No  Awareness, Dental Status Current problems with teeth and/or dentures?: No Does patient usually wear dentures?: No  CIWA:  CIWA-Ar Total: 2 COWS:     Musculoskeletal: Strength & Muscle Tone: within normal limits Gait & Station: normal Patient leans: N/A  Psychiatric Specialty Exam: Physical Exam  Nursing note and vitals reviewed.   Review of Systems  Psychiatric/Behavioral: Positive for depression and substance abuse. The patient is nervous/anxious.   All other systems reviewed and are negative.  Blood pressure (!) 121/92, pulse (!) 110, temperature 97.9 F (36.6 C), temperature source Oral, resp. rate 18, height 5\' 8"  (1.727 m), weight 79.8 kg (176 lb), SpO2 100 %.Body mass index is 26.76 kg/m.  General Appearance: Disheveled  Eye Contact:  Minimal  Speech:  Normal Rate  Volume:  Decreased  Mood:  Dysphoric  Affect:  Congruent  Thought Process:  Goal Directed and Descriptions of Associations: Circumstantial  Orientation:  Other:  person, place  Thought Content:  Delusions, Paranoid Ideation and Rumination  Suicidal Thoughts:  No  Homicidal Thoughts:  denies - but is a danger to self or others due to paranoia  Memory:  Immediate;   Fair Recent;   Fair Remote;   Poor  Judgement:  Impaired  Insight:  Shallow  Psychomotor Activity:  Restlessness  Concentration:  Concentration: Fair and Attention Span: Fair  Recall:  [employer] of Knowledge:  Fair  Language:  Fair  Akathisia:  No  Handed:  Right  AIMS (if indicated):     Assets:  Desire for Improvement  ADL's:  Intact  Cognition:  WNL  Sleep:  Number of Hours: 6     Treatment Plan Summary:Zachari is a 37 y old single , AAM who lives in [place] , [place], has a hx of alcohol use disorder and depression, presented to [facility] brought in by EMS for psychosis/AMS.  Patient today continues to be delusional , grandiose, paranoid- will continue treatment.  Daily contact with patient to assess and evaluate symptoms and progress  in treatment and Medication management   Will start a trial of Abilify 5 mg po qhs for psychosis. Will add Zoloft 25 mg po daily for affective sx. Will continue CIWA/Librium protocol for alcohol withdrawal sx. Will make available PRN medications as per agitation protocol. Reviewed past medical records,treatment plan.  Will continue to monitor vitals ,medication compliance and treatment side effects while patient is here.  Will monitor for medical issues as well as call consult as needed.  Reviewed ekg for qtc - wnl ,will order tsh, lipid panel, hba1c, pl . CSW will continue working on disposition. Pt to be referred to substance abuse treatment program.He is motivated to do so. Patient to participate in therapeutic milieu .      Bob Daversa, MD 11/21/2015, 1:40 PM

## 2015-11-21 NOTE — Progress Notes (Signed)
Patient was transferred from 300 hall to 500 hall this morning.   D:  Patient's self inventory sheet, patient sleeps good, no sleep medication given.  Good appetite, low energy level, poor concentration.  Rated depression and anxiety 8, hopeless 2.  Withdrawals, tremors, chilling, cravings, cramping, agitation, irritability.  Denied SI.  Physical problems, neck, head, worst pain in past 24 hours is #5.  Pain medication is not helpful.  Goal is aftercare turning on check.  Turn on check call around for aftercare.  No discharge plans.  Lack of resources. A:  Medications administered per MD order.  Emotional support and encouragement given patient. R:  Denied SI and HI, contracts for safety.  Denied A/V hallucinations.  Patient has been talking to himself while in the hallway today.  Safety maintained with 15 minute checks.

## 2015-11-21 NOTE — Plan of Care (Signed)
Problem: Coping: Goal: Ability to cope will improve Outcome: Progressing Nurse discussed depression/coping skills with patient.    

## 2015-11-21 NOTE — Progress Notes (Signed)
D    Pt has been calm and cooperative     He attended group but has had limited interaction with others    He tends to isolate   He did not ask for extra medications and went to sleep before he got his scheduled medications   Pt was instructed to come get his medications after group but got a snack and went to bed  A    Verbal support offered   Medications offered   Q 15 min checks R   Pt in bed resting with his eyes closed  Not distress noted  He is safe

## 2015-11-22 MED ORDER — BENZTROPINE MESYLATE 0.5 MG PO TABS
0.5000 mg | ORAL_TABLET | Freq: Every day | ORAL | Status: DC
  Administered 2015-11-23 – 2015-11-28 (×6): 0.5 mg via ORAL
  Filled 2015-11-22 (×8): qty 1

## 2015-11-22 MED ORDER — BENZTROPINE MESYLATE 0.5 MG PO TABS
0.5000 mg | ORAL_TABLET | Freq: Every day | ORAL | Status: DC
  Administered 2015-11-22: 0.5 mg via ORAL
  Filled 2015-11-22: qty 1

## 2015-11-22 MED ORDER — ARIPIPRAZOLE 5 MG PO TABS
5.0000 mg | ORAL_TABLET | Freq: Every day | ORAL | Status: DC
  Administered 2015-11-22: 5 mg via ORAL
  Filled 2015-11-22 (×3): qty 1

## 2015-11-22 MED ORDER — ARIPIPRAZOLE 10 MG PO TABS
10.0000 mg | ORAL_TABLET | Freq: Every day | ORAL | Status: DC
  Administered 2015-11-23 – 2015-11-28 (×6): 10 mg via ORAL
  Filled 2015-11-22 (×8): qty 1

## 2015-11-22 NOTE — Progress Notes (Signed)
D: Pt presents anxious on contact but brightens up on interactions. Alert but remains delusional and tangential in speech. Denies SI, HI and AVh, however, pt observed talking to self at intervals during rounds. Paranoid, believes others might be looking at him and talking about him as well. Rates his depression 5/10, hopelessness 2/10 and anxiety 3/10 on self inventory sheet. Reported good sleep and appetite.  A: Scheduled medications administered as ordered. Support and availability provided to pt. Encouraged to voice concerns and needs. Safety maintained on Q15 minutes checks without outburst.  R: Pt receptive to care. Cooperative with unit routines. Attended scheduled groups. Accompanied to 300 hall to play piano and returned to 500 without issues. Safety maintained on and off unit.

## 2015-11-22 NOTE — Progress Notes (Signed)
Recreation Therapy Notes  Date: 11/22/15 Time: 1000 Location: 500 Hall Dayroom  Group Topic: Anger Management  Goal Area(s) Addresses:  Patient will identify triggers for anger.  Patient will identify physical reaction to anger.   Patient will identify benefit of using coping skills when angry.  Behavioral Response:  Attentive  Intervention: Worksheet, colored pencils  Activity: Anger thermometer.  Patients were to rank the things that make them angry on a scale from 1 to 10, with 10 being the angriest you get.  Patients were to also give a description of that experience.  Patients were to then come up with coping skills to help them deal with their anger.    Education: Anger Management, Discharge Planning   Education Outcome: Acknowledges education/In group clarification offered/Needs additional education.   Clinical Observations/Feedback: Pt ranked some of his anger experiences as (10) people trying to steal his music, (8) judging a book by it's cover and (7) "fake love".  Pt stated his coping skills were creativity and nature.   Caroll RancherMarjette Jamelle Noy, LRT/CTRS     Caroll RancherLindsay, Malakie Balis A 11/22/2015 12:10 PM

## 2015-11-22 NOTE — Tx Team (Signed)
Interdisciplinary Treatment and Diagnostic Plan Update  11/22/2015 Time of Session: 1:41 PM  ZANDEN COLVER MRN: [id number]  Principal Diagnosis: MDD (major depressive disorder), recurrent, severe, with psychosis ([place])  Secondary Diagnoses: Principal Problem:   MDD (major depressive disorder), recurrent, severe, with psychosis ([place]) Active Problems:   Alcohol use disorder, severe, dependence ([place])   Cocaine use disorder, mild, abuse   Current Medications:  Current Facility-Administered Medications  Medication Dose Route Frequency Provider Last Rate Last Dose  . acetaminophen (TYLENOL) tablet 650 mg  650 mg Oral Q6H PRN Shuvon B Rankin, NP      . alum & mag hydroxide-simeth (MAALOX/MYLANTA) 200-200-20 MG/5ML suspension 30 mL  30 mL Oral Q4H PRN Shuvon B Rankin, NP      . [START ON 11/23/2015] ARIPiprazole (ABILIFY) tablet 10 mg  10 mg Oral Daily Ursula Alert, MD      . Derrill Memo ON 11/23/2015] benztropine (COGENTIN) tablet 0.5 mg  0.5 mg Oral Daily Saramma Eappen, MD      . chlordiazePOXIDE (LIBRIUM) capsule 25 mg  25 mg Oral BH-qamhs Derrill Center, NP   25 mg at 11/22/15 0825   Followed by  . [START ON 11/23/2015] chlordiazePOXIDE (LIBRIUM) capsule 25 mg  25 mg Oral Daily Derrill Center, NP      . chlordiazePOXIDE (LIBRIUM) capsule 25 mg  25 mg Oral QID PRN Ursula Alert, MD      . diphenhydrAMINE (BENADRYL) injection 25 mg  25 mg Intramuscular Q8H PRN Ursula Alert, MD       Or  . diphenhydrAMINE (BENADRYL) capsule 25 mg  25 mg Oral Q8H PRN Saramma Eappen, MD      . haloperidol (HALDOL) tablet 5 mg  5 mg Oral Q8H PRN Ursula Alert, MD       Or  . haloperidol lactate (HALDOL) injection 5 mg  5 mg Intramuscular Q8H PRN Saramma Eappen, MD      . hydrOXYzine (ATARAX/VISTARIL) tablet 25 mg  25 mg Oral Q6H PRN Derrill Center, NP   25 mg at 11/19/15 2111  . loperamide (IMODIUM) capsule 2-4 mg  2-4 mg Oral PRN Derrill Center, NP      . magnesium hydroxide (MILK OF MAGNESIA) suspension 30  mL  30 mL Oral Daily PRN Shuvon B Rankin, NP      . multivitamin with minerals tablet 1 tablet  1 tablet Oral Daily Encarnacion Slates, NP   1 tablet at 11/22/15 0826  . nicotine (NICODERM CQ - dosed in mg/24 hours) patch 21 mg  21 mg Transdermal Daily Derrill Center, NP   21 mg at 11/22/15 0826  . ondansetron (ZOFRAN-ODT) disintegrating tablet 4 mg  4 mg Oral Q6H PRN Derrill Center, NP      . sertraline (ZOLOFT) tablet 25 mg  25 mg Oral Daily Ursula Alert, MD   25 mg at 11/22/15 0826  . thiamine (VITAMIN B-1) tablet 100 mg  100 mg Oral Daily Jenne Campus, MD   100 mg at 11/22/15 0826  . traZODone (DESYREL) tablet 50 mg  50 mg Oral QHS PRN Lurena Nida, NP   50 mg at 11/19/15 2222    PTA Medications: Prescriptions Prior to Admission  Medication Sig Dispense Refill Last Dose  . LORazepam (ATIVAN) 1 MG tablet Take 101m 3 times a day for 1 day,Take 178mtwice a day for 1 day, take 1 mg daily for 1 day. then stop. (Patient not taking: Reported on 11/18/2015) 6 tablet  0 Completed Course at Unknown time  . Multiple Vitamin (MULTIVITAMIN WITH MINERALS) TABS tablet Take 1 tablet by mouth daily. (Patient not taking: Reported on 11/18/2015) 30 tablet 0 Not Taking at Unknown time  . thiamine 100 MG tablet Take 1 tablet (100 mg total) by mouth daily. (Patient not taking: Reported on 11/18/2015) 30 tablet 0 Not Taking at Unknown time    Treatment Modalities: Medication Management, Group therapy, Case management,  1 to 1 session with clinician, Psychoeducation, Recreational therapy.   Physician Treatment Plan for Primary Diagnosis: MDD (major depressive disorder), recurrent, severe, with psychosis ([place]) Long Term Goal(s): Improvement in symptoms so as ready for discharge  Short Term Goals: Compliance with prescribed medications will improve  Medication Management: Evaluate patient's response, side effects, and tolerance of medication regimen.  Therapeutic Interventions: 1 to 1 sessions, Unit Group sessions  and Medication administration.  Evaluation of Outcomes: Not Met  Physician Treatment Plan for Secondary Diagnosis: Principal Problem:   MDD (major depressive disorder), recurrent, severe, with psychosis ([place]) Active Problems:   Alcohol use disorder, severe, dependence ([place])   Cocaine use disorder, mild, abuse   Long Term Goal(s): Improvement in symptoms so as ready for discharge  Short Term Goals: Ability to identify triggers associated with substance abuse/mental health issues will improve  Medication Management: Evaluate patient's response, side effects, and tolerance of medication regimen.  Therapeutic Interventions: 1 to 1 sessions, Unit Group sessions and Medication administration.  Evaluation of Outcomes: Not Met   RN Treatment Plan for Primary Diagnosis: MDD (major depressive disorder), recurrent, severe, with psychosis ([place]) Long Term Goal(s): Knowledge of disease and therapeutic regimen to maintain health will improve  Short Term Goals: Compliance with prescribed medications will improve  Medication Management: RN will administer medications as ordered by provider, will assess and evaluate patient's response and provide education to patient for prescribed medication. RN will report any adverse and/or side effects to prescribing provider.  Therapeutic Interventions: 1 on 1 counseling sessions, Psychoeducation, Medication administration, Evaluate responses to treatment, Monitor vital signs and CBGs as ordered, Perform/monitor CIWA, COWS, AIMS and Fall Risk screenings as ordered, Perform wound care treatments as ordered.  Evaluation of Outcomes: Not Met   LCSW Treatment Plan for Primary Diagnosis: MDD (major depressive disorder), recurrent, severe, with psychosis ([place]) Long Term Goal(s): Safe transition to appropriate next level of care at discharge, Engage patient in therapeutic group addressing interpersonal concerns.  Short Term Goals: Engage patient in aftercare  planning with referrals and resources  Therapeutic Interventions: Assess for all discharge needs, 1 to 1 time with Social worker, Explore available resources and support systems, Assess for adequacy in community support network, Educate family and significant other(s) on suicide prevention, Complete Psychosocial Assessment, Interpersonal group therapy.  Evaluation of Outcomes: Met   Progress in Treatment: Attending groups: Yes Participating in groups: Yes Taking medication as prescribed: Yes Toleration medication: Yes, no side effects reported at this time Family/Significant other contact made: No Patient understands diagnosis: No  Limited insight Discussing patient identified problems/goals with staff: Yes Medical problems stabilized or resolved: Yes Denies suicidal/homicidal ideation: Yes Issues/concerns per patient self-inventory: None Other: N/A  New problem(s) identified: None identified at this time.   New Short Term/Long Term Goal(s): None identified at this time.   Discharge Plan or Barriers:  States he will [place] the [place] and find a job while following up at [employer]  Reason for Continuation of Hospitalization:  Delusions   Hallucinations  Mania  Medication stabilization  Withdrawal symptoms  Estimated Length of Stay: 3-5 days  Attendees: Patient: 11/22/2015  1:41 PM  Physician: Ursula Alert, MD 11/22/2015  1:41 PM  Nursing: Hoy Register, RN 11/22/2015  1:41 PM  RN Care Manager: Lars Pinks, RN 11/22/2015  1:41 PM  Social Worker: Ripley Fraise 11/22/2015  1:41 PM  Recreational Therapist: Laretta Bolster  11/22/2015  1:41 PM  Other: Norberto Sorenson 11/22/2015  1:41 PM  Other:  11/22/2015  1:41 PM    Scribe for Treatment Team:  Roque Lias 11/22/2015 1:41 PM

## 2015-11-22 NOTE — [facility] Group Notes (Signed)
BHH LCSW Group Therapy  11/22/2015 , 1:51 PM   Type of Therapy:  Group Therapy  Participation Level:  Active  Participation Quality:  Attentive  Affect:  Appropriate  Cognitive:  Alert  Insight:  Improving  Engagement in Therapy:  Engaged  Modes of Intervention:  Discussion, Exploration and Socialization  Summary of Progress/Problems: Today's group focused on the term Diagnosis.  Participants were asked to define the term, and then pronounce whether it is a negative, positive or neutral term.  Stayed the entire time, engaged throughout.  Interesting person to have in group as he is willing to interject thoughts periodically that are subject related and thoughtful.  Talked about what he has learned in the [employer], and also stressed importance of sharing suggestions and wisdom with others.  Daryel Geraldorth, Ainhoa Rallo B 11/22/2015 , 1:51 PM

## 2015-11-22 NOTE — BHH Counselor (Signed)
Adult Comprehensive Assessment  Patient ID: Robert Robert Hurley E Robert Hurley, male   DOB: [birth year], 45 y.o.   MRN: [id number]  Information Source: Information source: Patient  Current Stressors:  Employment / Job issues: States he is currently not getting disability because his mother did not let him know he was getting letters about renewing Family Relationships: There are family members here in [place] that may take him in.   Financial / Lack of resources (include bankruptcy): Currently no income Housing / Lack of housing: Homeless Substance abuse: Says he drinks 2 gallons of liquor daily  Living/Environment/Situation:  Living Arrangements: Alone  Family History:  Does patient have children?: Yes How many children?: 3 How is patient's relationship with their children?: States he has a 316 and 44 Year old that live in a condo he set them up with in [place].  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."  Discharge Plan:   Does patient have access to transportation?: Yes Will patient be returning to same living situation after discharge?: No Plan for living situation after discharge: Unknown Currently receiving community mental health services: No If no, would patient like referral for services when discharged?: Yes (What county?) Medical sales representative([place]) Does patient have financial barriers related to discharge medications?: Yes Patient description of barriers related to discharge medications: No  income  Summary/Recommendations:   Summary and Recommendations (to be completed by the evaluator): Robert Robert Hurley is a 45 YO AA male diagnosed with MDD, recurrent, severe with psychosis and alcohol use disorder.  He presents with limited insight and motivation.  Robert Robert Hurley initially stated he needs to go to long term substance abuse rehab, but rejected both [facility] and [facility] as options.  Also rejected half way house as an options, saying his mother has all his money and by contacting her about this he would be putting her life in danger.  His conversation with me was fraught with paranoia, grandiosity and delusions.  He refused to answer most of my questions, preferring to ramble on about his money and money-making brainstorms. He is unconcerned about his plans from here, saying he will find some work to make money so that he can work out a living situation.  Robert Robert Hurley can benefit from crises stabilization, medication management, therapeutic milieu and referral for services.   Robert Robert Hurley. 11/22/2015

## 2015-11-22 NOTE — Progress Notes (Signed)
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 "    Objective:Tre is a 89 y old single , AAM who lives in [place] , [place], has a hx of alcohol use disorder and depression, [facility].  Patient seen and chart reviewed.Discussed patient with treatment team.  Patient today continues to be delusional, grandiose , anxious , seen as coloring in his room , avoids eye contact. Per staff - he is seen as delusional and labile often , requiring redirection. Pt refused his medications last night - when questioned about this - pt seen as going tangential - states " I did not get enough notice about having a room mate and I told them I do not want one." Pt continues to have some withdrawal sx like headaches, tolerating Librium well.      Principal Problem: MDD (major depressive disorder), recurrent, severe, with psychosis (HCC); R/O Schizophrenia Diagnosis:   Patient Active Problem List   Diagnosis Date Noted  . MDD (major depressive disorder), recurrent, severe, with psychosis (HCC) [F33.3] 11/21/2015  . Cocaine use disorder, mild, abuse [F14.10] 11/21/2015  . Alcohol use disorder, severe, dependence (HCC) [F10.20] 11/20/2015    Class: Acute  . Alcohol intoxication (HCC) [F10.129]   . Syncope [R55] 09/24/2015  . Alcohol withdrawal (HCC) [F10.239] 09/23/2015  . Altered mental status [R41.82] 09/23/2015   Total Time spent with patient: 25 minutes  Past Psychiatric History: Please see H&P.   Past Medical History: Please see H&P.  Family History: Please see H&P.  Family Psychiatric  History: Mother's side of family has mental illness, aunts and uncles have alcoholism. Social History: Please see H&P.  History  Alcohol use Not on file    Comment: 14 gallons per week (2 gallons per day)      History  Drug Use    Social History   Social History  . Marital status: Single    Spouse name: N/A  . Number of children: N/A  . Years of education: N/A   Social History Main Topics  . Smoking status: Current Every Day Smoker    Packs/day: 1.00    Types: Cigarettes  . Smokeless tobacco: Never Used  . Alcohol use None     Comment: 14 gallons per week (2 gallons per day)  . Drug use:   . Sexual activity: Not Asked   Other Topics Concern  . None   Social History Narrative  . None   Additional Social History:    Pain Medications: See MAR Prescriptions: See MAR Over the Counter: See MAR History of alcohol / drug use?: Yes Longest period of sobriety (when/how long): 5 years Negative Consequences of Use: Financial, Personal relationships Withdrawal Symptoms: Tremors, Other (Comment) (headaches) Name of Substance 1: Alcohol 1 - Age of First Use: 22 1 - Amount (size/oz): 2 gallons per day 1 - Frequency: daily 1 - Duration: Last two years 1 - Last Use / Amount: 11/18/15 pt reports consuming 1/5 th of liquor                  Sleep: Fair  Appetite:  Fair  Current Medications: Current Facility-Administered Medications  Medication Dose Route Frequency Provider Last Rate Last Dose  . acetaminophen (TYLENOL) tablet 650 mg  650 mg Oral Q6H PRN Shuvon B  Rankin, NP      . alum & mag hydroxide-simeth (MAALOX/MYLANTA) 200-200-20 MG/5ML suspension 30 mL  30 mL Oral Q4H PRN Shuvon B Rankin, NP      . Melene Muller ON 11/23/2015] ARIPiprazole (ABILIFY) tablet 10 mg  10 mg Oral Daily Jomarie Longs, MD      . Melene Muller ON 11/23/2015] benztropine (COGENTIN) tablet 0.5 mg  0.5 mg Oral Daily Elene Downum, MD      . chlordiazePOXIDE (LIBRIUM) capsule 25 mg  25 mg Oral BH-qamhs Oneta Rack, NP   25 mg at 11/22/15 0825   Followed by  . [START ON 11/23/2015] chlordiazePOXIDE (LIBRIUM) capsule 25 mg  25 mg Oral Daily Oneta Rack, NP      . chlordiazePOXIDE (LIBRIUM) capsule 25 mg  25 mg Oral  QID PRN Jomarie Longs, MD      . diphenhydrAMINE (BENADRYL) injection 25 mg  25 mg Intramuscular Q8H PRN Jomarie Longs, MD       Or  . diphenhydrAMINE (BENADRYL) capsule 25 mg  25 mg Oral Q8H PRN Yakub Lodes, MD      . haloperidol (HALDOL) tablet 5 mg  5 mg Oral Q8H PRN Jomarie Longs, MD       Or  . haloperidol lactate (HALDOL) injection 5 mg  5 mg Intramuscular Q8H PRN Glynnis Gavel, MD      . hydrOXYzine (ATARAX/VISTARIL) tablet 25 mg  25 mg Oral Q6H PRN Oneta Rack, NP   25 mg at 11/19/15 2111  . loperamide (IMODIUM) capsule 2-4 mg  2-4 mg Oral PRN Oneta Rack, NP      . magnesium hydroxide (MILK OF MAGNESIA) suspension 30 mL  30 mL Oral Daily PRN Shuvon B Rankin, NP      . multivitamin with minerals tablet 1 tablet  1 tablet Oral Daily Sanjuana Kava, NP   1 tablet at 11/22/15 0826  . nicotine (NICODERM CQ - dosed in mg/24 hours) patch 21 mg  21 mg Transdermal Daily Oneta Rack, NP   21 mg at 11/22/15 0826  . ondansetron (ZOFRAN-ODT) disintegrating tablet 4 mg  4 mg Oral Q6H PRN Oneta Rack, NP      . sertraline (ZOLOFT) tablet 25 mg  25 mg Oral Daily Jomarie Longs, MD   25 mg at 11/22/15 0826  . thiamine (VITAMIN B-1) tablet 100 mg  100 mg Oral Daily Craige Cotta, MD   100 mg at 11/22/15 0826  . traZODone (DESYREL) tablet 50 mg  50 mg Oral QHS PRN Kristeen Mans, NP   50 mg at 11/19/15 2222    Lab Results: No results found for this or any previous visit (from the past 48 hour(s)).  Blood Alcohol level:  Lab Results  Component Value Date   ETH 167 (H) 11/18/2015   ETH 258 (H) 09/23/2015    Metabolic Disorder Labs: No results found for: HGBA1C, MPG No results found for: PROLACTIN No results found for: CHOL, TRIG, HDL, CHOLHDL, VLDL, LDLCALC  Physical Findings: AIMS: Facial and Oral Movements Muscles of Facial Expression: None, normal Lips and Perioral Area: None, normal Jaw: None, normal Tongue: None, normal,Extremity Movements Upper (arms, wrists,  hands, fingers): None, normal Lower (legs, knees, ankles, toes): None, normal, Trunk Movements Neck, shoulders, hips: None, normal, Overall Severity Severity of abnormal movements (highest score from questions above): None, normal Incapacitation due to abnormal movements: None, normal Patient's awareness of abnormal movements (rate only patient's report): No Awareness, Dental Status Current problems  with teeth and/or dentures?: No Does patient usually wear dentures?: No  CIWA:  CIWA-Ar Total: 2 COWS:  COWS Total Score: 3  Musculoskeletal: Strength & Muscle Tone: within normal limits Gait & Station: normal Patient leans: N/A  Psychiatric Specialty Exam: Physical Exam  Nursing note and vitals reviewed.   Review of Systems  Psychiatric/Behavioral: Positive for depression and substance abuse. The patient is nervous/anxious.   All other systems reviewed and are negative.   Blood pressure 137/88, pulse 97, temperature 97.8 F (36.6 C), temperature source Oral, resp. rate 20, height 5\' 8"  (1.727 m), weight 79.8 kg (176 lb), SpO2 100 %.Body mass index is 26.76 kg/m.  General Appearance: Disheveled  Eye Contact:  Minimal  Speech:  Normal Rate  Volume:  Decreased  Mood:  Dysphoric  Affect:  Congruent  Thought Process:  Goal Directed and Descriptions of Associations: Circumstantial  Orientation:  Other:  person, place  Thought Content:  Delusions, Paranoid Ideation and Rumination  Suicidal Thoughts:  No  Homicidal Thoughts:  denies - but is a danger to self or others due to paranoia  Memory:  Immediate;   Fair Recent;   Fair Remote;   Poor  Judgement:  Impaired  Insight:  Shallow  Psychomotor Activity:  Restlessness  Concentration:  Concentration: Fair and Attention Span: Fair  Recall:  [employer] of Knowledge:  Fair  Language:  Fair  Akathisia:  No  Handed:  Right  AIMS (if indicated):     Assets:  Desire for Improvement  ADL's:  Intact  Cognition:  WNL  Sleep:  Number of  Hours: 6.5     Treatment Plan Summary:Zakai is a 55 y old single , AAM who lives in [place] , [place], has a hx of alcohol use disorder and depression, presented to [facility] brought in by EMS for psychosis/AMS.  Patient today continues to be delusional , grandiose, paranoid- will continue treatment.  Daily contact with patient to assess and evaluate symptoms and progress in treatment and Medication management   Will increase Abilify to 10 mg po qhs for psychosis. Will continue Zoloft 25 mg po daily for affective sx. Will continue CIWA/Librium protocol for alcohol withdrawal sx. Will make available PRN medications as per agitation protocol. Reviewed past medical records,treatment plan.  Will continue to monitor vitals ,medication compliance and treatment side effects while patient is here.  Will monitor for medical issues as well as call consult as needed.  Reviewed ekg for qtc - wnl ,will order tsh, lipid panel, hba1c, pl . CSW will continue working on disposition. Pt to be referred to substance abuse treatment program.He is motivated to do so. Patient to participate in therapeutic milieu .      Jailine Lieder, MD 11/22/2015, 1:43 PM

## 2015-11-23 DIAGNOSIS — F333 Major depressive disorder, recurrent, severe with psychotic symptoms: Principal | ICD-10-CM

## 2015-11-23 LAB — LIPID PANEL
CHOL/HDL RATIO: 1.9 ratio
Cholesterol: 177 mg/dL (ref 0–200)
HDL: 93 mg/dL (ref >40)
LDL CALC: 74 mg/dL (ref 0–99)
TRIGLYCERIDES: 52 mg/dL (ref <150)
VLDL: 10 mg/dL (ref 0–40)

## 2015-11-23 LAB — TSH: TSH: 0.767 u[IU]/mL (ref 0.350–4.500)

## 2015-11-23 NOTE — Plan of Care (Signed)
Problem: Self-Concept: Goal: Level of anxiety will decrease Outcome: Progressing Pt rates his anxiety level 2/10 on self inventory sheet. Stated to Clinical research associatewriter "my mood is becoming more stable since I've started taking my medicines" at time of assessment.  Problem: Safety: Goal: Ability to remain free from injury will improve Outcome: Progressing Q 15 minutes safety checks remains effective on and off unit without reports or gestures of self harm to note at this time.

## 2015-11-23 NOTE — Progress Notes (Signed)
D: Pt A & O X3. Denies SI, HI, AVH and pain when assessed. Rates his depression 4/10, hopelessness 0/10 and anxiety 2/10 on self inventory sheet. Per pt, he had poor sleep last night related to frequent bathroom visits, "I guess I drank too much last night, because I kept going to the bathroom all night. Pt's goal for today is "temperament and discharge". 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 Slight tremors noted in bilateral hands during assessment. A: Encouragement and support provided to pt. Scheduled medications administered as prescribed. Q 15 minutes checks maintained for safety without outburst or self harm gestures.  R: Pt receptive to care. Compliant with medications when offered. Denies adverse drug reactions when assessed. Attended scheduled groups on unit. Tolerates meals and fluids well. Denies concerns at this time. Safety maintained on and off unit. POC remains effective.

## 2015-11-23 NOTE — Progress Notes (Signed)
[facility] MD Progress Note  11/23/2015 12:36 PM Robert Hurley  MRN:  [id number] Subjective: Patient states "my mood is stabilizing."  Objective: Robert Hurley is a 77 y old single , AAM who lives in [place] , [place], has a hx of alcohol use disorder and depression, [facility] brought in by EMS for psychosis/AMS.  Patient seen and chart reviewed.Discussed patient with treatment team.  Patient today was alert.  He was in the medication window.  When asked if he was taking meds, he said yes.  Nurse confirms.  Patient does not remember how he ws admitted.  She continues to have poor insight to hi alcohol problem.  He does not appear to be  delusional, grandiose.  He asked about his liver enzymes. 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 Pt refused his medications last night - when questioned about this - pt seen as going tangential Continues to tolerate Librium well.      Principal Problem: MDD (major depressive disorder), recurrent, severe, with psychosis (HCC); R/O Schizophrenia Diagnosis:   Patient Active Problem List   Diagnosis Date Noted  . MDD (major depressive disorder), recurrent, severe, with psychosis (HCC) [F33.3] 11/21/2015  . Cocaine use disorder, mild, abuse [F14.10] 11/21/2015  . Alcohol use disorder, severe, dependence (HCC) [F10.20] 11/20/2015    Class: Acute  . Alcohol intoxication (HCC) [F10.129]   . Syncope [R55] 09/24/2015  . Alcohol withdrawal (HCC) [F10.239] 09/23/2015  . Altered mental status [R41.82] 09/23/2015   Total Time spent with patient: 25 minutes  Past Psychiatric History: Please see H&P.   Past Medical History: Please see H&P.  Family History: Please see H&P.  Family Psychiatric  History: Mother's side of family has mental illness, aunts and uncles have alcoholism. Social History: Please see  H&P.  History  Alcohol use Not on file    Comment: 14 gallons per week (2 gallons per day)     History  Drug Use    Social History   Social History  . Marital status: Single    Spouse name: N/A  . Number of children: N/A  . Years of education: N/A   Social History Main Topics  . Smoking status: Current Every Day Smoker    Packs/day: 1.00    Types: Cigarettes  . Smokeless tobacco: Never Used  . Alcohol use None     Comment: 14 gallons per week (2 gallons per day)  . Drug use:   . Sexual activity: Not Asked   Other Topics Concern  . None   Social History Narrative  . None   Additional Social History:    Pain Medications: See MAR Prescriptions: See MAR Over the Counter: See MAR History of alcohol / drug use?: Yes Longest period of sobriety (when/how long): 5 years Negative Consequences of Use: Financial, Personal relationships Withdrawal Symptoms: Tremors, Other (Comment) (headaches) Name of Substance 1: Alcohol 1 - Age of First Use: 22 1 - Amount (size/oz): 2 gallons per day 1 - Frequency: daily 1 - Duration: Last two years 1 - Last Use / Amount: 11/18/15 pt reports consuming 1/5 th of liquor                  Sleep: Fair  Appetite:  Fair  Current Medications: Current Facility-Administered Medications  Medication Dose Route Frequency Provider Last Rate Last Dose  . acetaminophen (TYLENOL)  tablet 650 mg  650 mg Oral Q6H PRN Shuvon B Rankin, NP   650 mg at 11/23/15 0802  . alum & mag hydroxide-simeth (MAALOX/MYLANTA) 200-200-20 MG/5ML suspension 30 mL  30 mL Oral Q4H PRN Shuvon B Rankin, NP      . ARIPiprazole (ABILIFY) tablet 10 mg  10 mg Oral Daily Jomarie LongsSaramma Eappen, MD   10 mg at 11/23/15 0759  . benztropine (COGENTIN) tablet 0.5 mg  0.5 mg Oral Daily Saramma Eappen, MD   0.5 mg at 11/23/15 0800  . chlordiazePOXIDE (LIBRIUM) capsule 25 mg  25 mg Oral QID PRN Jomarie LongsSaramma Eappen, MD      . diphenhydrAMINE (BENADRYL) injection 25 mg  25 mg Intramuscular Q8H PRN  Jomarie LongsSaramma Eappen, MD       Or  . diphenhydrAMINE (BENADRYL) capsule 25 mg  25 mg Oral Q8H PRN Saramma Eappen, MD      . haloperidol (HALDOL) tablet 5 mg  5 mg Oral Q8H PRN Jomarie LongsSaramma Eappen, MD       Or  . haloperidol lactate (HALDOL) injection 5 mg  5 mg Intramuscular Q8H PRN Saramma Eappen, MD      . magnesium hydroxide (MILK OF MAGNESIA) suspension 30 mL  30 mL Oral Daily PRN Shuvon B Rankin, NP      . multivitamin with minerals tablet 1 tablet  1 tablet Oral Daily Sanjuana KavaAgnes I Nwoko, NP   1 tablet at 11/23/15 0759  . nicotine (NICODERM CQ - dosed in mg/24 hours) patch 21 mg  21 mg Transdermal Daily Oneta Rackanika N Lewis, NP   21 mg at 11/22/15 0826  . sertraline (ZOLOFT) tablet 25 mg  25 mg Oral Daily Jomarie LongsSaramma Eappen, MD   25 mg at 11/23/15 0759  . thiamine (VITAMIN B-1) tablet 100 mg  100 mg Oral Daily Craige CottaFernando A Cobos, MD   100 mg at 11/23/15 0759  . traZODone (DESYREL) tablet 50 mg  50 mg Oral QHS PRN Kristeen MansFran E Hobson, NP   50 mg at 11/19/15 2222    Lab Results:  Results for orders placed or performed during the hospital encounter of 11/19/15 (from the past 48 hour(s))  TSH     Status: None   Collection Time: 11/23/15  6:55 AM  Result Value Ref Range   TSH 0.767 0.350 - 4.500 uIU/mL    Comment: [facility]  Lipid panel     Status: None   Collection Time: 11/23/15  6:55 AM  Result Value Ref Range   Cholesterol 177 0 - 200 mg/dL   Triglycerides 52 <914<150 mg/dL   HDL 93 >78>40 mg/dL   Total CHOL/HDL Ratio 1.9 RATIO   VLDL 10 0 - 40 mg/dL   LDL Cholesterol 74 0 - 99 mg/dL    Comment:        Total Cholesterol/HDL:CHD Risk Coronary Heart Disease Risk Table                     Men   Women  1/2 Average Risk   3.4   3.3  Average Risk       5.0   4.4  2 X Average Risk   9.6   7.1  3 X Average Risk  23.4   11.0        Use the calculated Patient Ratio above and the CHD Risk Table to determine the patient's CHD Risk.        ATP III CLASSIFICATION (LDL):  <100  mg/dL    Optimal  161-096  mg/dL   Near or Above                    Optimal  130-159  mg/dL   Borderline  045-409  mg/dL   High  >811     mg/dL   Very High Performed at [facility]     Blood Alcohol level:  Lab Results  Component Value Date   ETH 167 (H) 11/18/2015   ETH 258 (H) 09/23/2015    Metabolic Disorder Labs: No results found for: HGBA1C, MPG No results found for: PROLACTIN Lab Results  Component Value Date   CHOL 177 11/23/2015   TRIG 52 11/23/2015   HDL 93 11/23/2015   CHOLHDL 1.9 11/23/2015   VLDL 10 11/23/2015   LDLCALC 74 11/23/2015    Physical Findings: AIMS: Facial and Oral Movements Muscles of Facial Expression: None, normal Lips and Perioral Area: None, normal Jaw: None, normal Tongue: None, normal,Extremity Movements Upper (arms, wrists, hands, fingers): None, normal Lower (legs, knees, ankles, toes): None, normal, Trunk Movements Neck, shoulders, hips: None, normal, Overall Severity Severity of abnormal movements (highest score from questions above): None, normal Incapacitation due to abnormal movements: None, normal Patient's awareness of abnormal movements (rate only patient's report): No Awareness, Dental Status Current problems with teeth and/or dentures?: No Does patient usually wear dentures?: No  CIWA:  CIWA-Ar Total: 3 COWS:  COWS Total Score: 3  Musculoskeletal: Strength & Muscle Tone: within normal limits Gait & Station: normal Patient leans: N/A  Psychiatric Specialty Exam: Physical Exam  Nursing note and vitals reviewed.   Review of Systems  Psychiatric/Behavioral: Positive for depression and substance abuse. The patient is nervous/anxious.   All other systems reviewed and are negative.   Blood pressure 134/89, pulse 92, temperature 97.8 F (36.6 C), temperature source Oral, resp. rate 18, height 5\' 8"  (1.727 m), weight 79.8 kg (176 lb), SpO2 100 %.Body mass index is 26.76 kg/m.  General Appearance: Disheveled  Eye Contact:   Minimal  Speech:  Normal Rate  Volume:  Decreased  Mood:  Dysphoric and Euthymic  Affect:  Appropriate and Congruent  Thought Process:  Goal Directed and Descriptions of Associations: Circumstantial  Orientation:  Other:  person, place  Thought Content:  Delusions, Paranoid Ideation and Rumination  Suicidal Thoughts:  No  Homicidal Thoughts:  denies - but is a danger to self or others due to paranoia  Memory:  Immediate;   Fair Recent;   Fair Remote;   Poor  Judgement:  Impaired  Insight:  Shallow  Psychomotor Activity:  Restlessness  Concentration:  Concentration: Fair and Attention Span: Fair  Recall:  [employer] of Knowledge:  Fair  Language:  Fair  Akathisia:  No  Handed:  Right  AIMS (if indicated):     Assets:  Desire for Improvement  ADL's:  Intact  Cognition:  WNL  Sleep:  Number of Hours: 6.75     Treatment Plan Summary:Shivaan is a 48 y old single , AAM who lives in [place] , [place], has a hx of alcohol use disorder and depression, presented to [facility] brought in by EMS for psychosis/AMS.  Patient today continues to be delusional , grandiose, paranoid- will continue treatment.  Daily contact with patient to assess and evaluate symptoms and progress in treatment and Medication management   Will increase Abilify to 10 mg po qhs for psychosis. Will continue Zoloft 25 mg po daily for affective sx.  Will continue CIWA/Librium protocol for alcohol withdrawal sx. Will make available PRN medications as per agitation protocol. Reviewed past medical records,treatment plan.  Will continue to monitor vitals ,medication compliance and treatment side effects while patient is here.  Will monitor for medical issues as well as call consult as needed.  Reviewed ekg for qtc - WNL, TSH WNL, lipid panel-WNL, hba1c pending, ordered prolactin. CSW will continue working on disposition. Pt to be referred to substance abuse treatment program.He is motivated to do so. Patient to participate  in therapeutic milieu .  Ordered LFT's Friday 9/8  Lindwood Qua, NP [facility] 11/23/2015, 12:36 PM   Agree with NP progress note as above  Dr. Jama Flavors

## 2015-11-23 NOTE — Progress Notes (Signed)
Recreation Therapy Notes  Date: 11/23/15 Time: 1000 Location: 500 Hall Dayroom  Group Topic: Self-Esteem  Goal Area(s) Addresses:  Patient will identify positive ways to increase self-esteem. Patient will verbalize benefit of increased self-esteem.  Behavioral Response: Engaged  Intervention: Worksheet, colored pencils  Activity: How I See Me.  LRT passed out worksheets of a mask with a blank face.  Patients were to fill in the mask with words and drawings of how they view themselves.   Education: Self-Esteem, Building control surveyorDischarge Planning.   Education Outcome: Acknowledges education/In group clarification offered/Needs additional education  Clinical Observations/Feedback: Pt explained self-esteem as the way you feel about yourself.  Pt some of the ways the pt described himself as were "awesome, loving, protects, truthful, generous, leader, stubborn, business, handsome, gracious, progress, fruitful and universal".  Pt stated all of these things are in his heart and it helps him to accept others for who they are.  Pt stated moving forward his positive self-esteem will help him to "control the influences around me, be around positive instead of negative".     Caroll RancherMarjette Bradyn Vassey, LRT/CTRS   Caroll RancherLindsay, Joaquin Knebel A 11/23/2015 11:31 AM

## 2015-11-23 NOTE — [facility] Group Notes (Signed)
[facility] LCSW Group Therapy  11/23/2015 2:32 PM   Type of Therapy:  Group Therapy   Participation Level:  Engaged  Participation Quality:  Attentive  Affect:  Appropriate   Cognitive:  Alert   Insight:  Engaged  Engagement in Therapy:  Improving   Modes of Intervention:  Education, Exploration, Socialization   Summary of Progress/Problems: Robert NeedleMichael was engaged throughout, stayed entire time.   Robert HuaDavid from [employer] was here to tell his story of recovery, inform patients about [employer] and play his guitar.   Robert DaubJolan Mohd. Hurley 11/23/2015 2:32 PM

## 2015-11-23 NOTE — Progress Notes (Signed)
Patient ID: Robert Hurley, male   DOB: [birth year], 45 y.o.   MRN: [id number]   Pt currently presents with an angry affect and suspicious behavior. Pt reports to Clinical research associatewriter that their goal is to "go play the piano." Per verbal report from day shift RN, Elna BreslowEappen, MD said that patient is allowed to play the piano on the 300 hall, see orders. Pt states "It's been a good day." Pt reports poor sleep with current medication regimen due to frequent urges to urinate last night.   Pt provided with medications per providers orders. Pt's labs and vitals were monitored throughout the night. Pt supported emotionally and encouraged to express concerns and questions. Pt educated on medications and positive coping skills.   Pt's safety ensured with 15 minute and environmental checks. Pt currently denies SI/HI and A/V hallucinations. Pt verbally agrees to seek staff if SI/HI or A/VH occurs and to consult with staff before acting on any harmful thoughts. Will continue POC.

## 2015-11-24 LAB — HEMOGLOBIN A1C
Hgb A1c MFr Bld: 5 % (ref 4.8–5.6)
Mean Plasma Glucose: 97 mg/dL

## 2015-11-24 MED ORDER — TRAZODONE HCL 150 MG PO TABS: 75.0000 mg | ORAL_TABLET | Freq: Every evening | ORAL | Status: DC | PRN

## 2015-11-24 NOTE — Plan of Care (Signed)
Problem: Coping: Goal: Ability to demonstrate self-control will improve Outcome: Progressing Pt is cooperative and understanding when waiting to play the piano on 300 hall

## 2015-11-24 NOTE — Tx Team (Signed)
Interdisciplinary Treatment and Diagnostic Plan Update  11/24/2015 Time of Session: 8:37 AM  RONALDO CRILLY MRN: [id number]  Principal Diagnosis: MDD (major depressive disorder), recurrent, severe, with psychosis ([place])  Secondary Diagnoses: Principal Problem:   MDD (major depressive disorder), recurrent, severe, with psychosis ([place]) Active Problems:   Alcohol use disorder, severe, dependence ([place])   Cocaine use disorder, mild, abuse   Current Medications:  Current Facility-Administered Medications  Medication Dose Route Frequency Provider Last Rate Last Dose  . acetaminophen (TYLENOL) tablet 650 mg  650 mg Oral Q6H PRN Shuvon B Rankin, NP   650 mg at 11/23/15 2220  . alum & mag hydroxide-simeth (MAALOX/MYLANTA) 200-200-20 MG/5ML suspension 30 mL  30 mL Oral Q4H PRN Shuvon B Rankin, NP      . ARIPiprazole (ABILIFY) tablet 10 mg  10 mg Oral Daily Ursula Alert, MD   10 mg at 11/24/15 0835  . benztropine (COGENTIN) tablet 0.5 mg  0.5 mg Oral Daily Ursula Alert, MD   0.5 mg at 11/24/15 0835  . chlordiazePOXIDE (LIBRIUM) capsule 25 mg  25 mg Oral QID PRN Ursula Alert, MD      . diphenhydrAMINE (BENADRYL) injection 25 mg  25 mg Intramuscular Q8H PRN Ursula Alert, MD       Or  . diphenhydrAMINE (BENADRYL) capsule 25 mg  25 mg Oral Q8H PRN Saramma Eappen, MD      . haloperidol (HALDOL) tablet 5 mg  5 mg Oral Q8H PRN Ursula Alert, MD       Or  . haloperidol lactate (HALDOL) injection 5 mg  5 mg Intramuscular Q8H PRN Saramma Eappen, MD      . magnesium hydroxide (MILK OF MAGNESIA) suspension 30 mL  30 mL Oral Daily PRN Shuvon B Rankin, NP      . multivitamin with minerals tablet 1 tablet  1 tablet Oral Daily Encarnacion Slates, NP   1 tablet at 11/24/15 0835  . nicotine (NICODERM CQ - dosed in mg/24 hours) patch 21 mg  21 mg Transdermal Daily Derrill Center, NP   21 mg at 11/24/15 0835  . sertraline (ZOLOFT) tablet 25 mg  25 mg Oral Daily Ursula Alert, MD   25 mg at 11/24/15 0835  .  thiamine (VITAMIN B-1) tablet 100 mg  100 mg Oral Daily Jenne Campus, MD   100 mg at 11/24/15 0835  . traZODone (DESYREL) tablet 50 mg  50 mg Oral QHS PRN Lurena Nida, NP   50 mg at 11/23/15 2220    PTA Medications: Prescriptions Prior to Admission  Medication Sig Dispense Refill Last Dose  . LORazepam (ATIVAN) 1 MG tablet Take 68m 3 times a day for 1 day,Take 175mtwice a day for 1 day, take 1 mg daily for 1 day. then stop. (Patient not taking: Reported on 11/18/2015) 6 tablet 0 Unknown  . Multiple Vitamin (MULTIVITAMIN WITH MINERALS) TABS tablet Take 1 tablet by mouth daily. (Patient not taking: Reported on 11/18/2015) 30 tablet 0 Unknown  . thiamine 100 MG tablet Take 1 tablet (100 mg total) by mouth daily. (Patient not taking: Reported on 11/18/2015) 30 tablet 0 Unknown    Treatment Modalities: Medication Management, Group therapy, Case management,  1 to 1 session with clinician, Psychoeducation, Recreational therapy.   Physician Treatment Plan for Primary Diagnosis: MDD (major depressive disorder), recurrent, severe, with psychosis ([place] Goal(s): Improvement in symptoms so as ready for discharge  Short Term Goals: Compliance with prescribed medications will improve  Medication  Management: Evaluate patient's response, side effects, and tolerance of medication regimen.  Therapeutic Interventions: 1 to 1 sessions, Unit Group sessions and Medication administration.  Evaluation of Outcomes: Met  Physician Treatment Plan for Secondary Diagnosis: Principal Problem:   MDD (major depressive disorder), recurrent, severe, with psychosis ([place]) Active Problems:   Alcohol use disorder, severe, dependence ([place])   Cocaine use disorder, mild, abuse   Long Term Goal(s): Improvement in symptoms so as ready for discharge  Short Term Goals: Ability to identify triggers associated with substance abuse/mental health issues will improve  Medication Management: Evaluate patient's response,  side effects, and tolerance of medication regimen.  Therapeutic Interventions: 1 to 1 sessions, Unit Group sessions and Medication administration.  Evaluation of Outcomes: Met   RN Treatment Plan for Primary Diagnosis: MDD (major depressive disorder), recurrent, severe, with psychosis ([place]) Long Term Goal(s): Knowledge of disease and therapeutic regimen to maintain health will improve  Short Term Goals: Compliance with prescribed medications will improve  Medication Management: RN will administer medications as ordered by provider, will assess and evaluate patient's response and provide education to patient for prescribed medication. RN will report any adverse and/or side effects to prescribing provider.  Therapeutic Interventions: 1 on 1 counseling sessions, Psychoeducation, Medication administration, Evaluate responses to treatment, Monitor vital signs and CBGs as ordered, Perform/monitor CIWA, COWS, AIMS and Fall Risk screenings as ordered, Perform wound care treatments as ordered.  Evaluation of Outcomes: Met   LCSW Treatment Plan for Primary Diagnosis: MDD (major depressive disorder), recurrent, severe, with psychosis ([place]) Long Term Goal(s): Safe transition to appropriate next level of care at discharge, Engage patient in therapeutic group addressing interpersonal concerns.  Short Term Goals: Engage patient in aftercare planning with referrals and resources  Therapeutic Interventions: Assess for all discharge needs, 1 to 1 time with Social worker, Explore available resources and support systems, Assess for adequacy in community support network, Educate family and significant other(s) on suicide prevention, Complete Psychosocial Assessment, Interpersonal group therapy.  Evaluation of Outcomes: Met   Progress in Treatment: Attending groups: Yes Participating in groups: Yes Taking medication as prescribed: Yes Toleration medication: Yes, no side effects reported at this  time Family/Significant other contact made: No Patient understands diagnosis: No  Limited insight Discussing patient identified problems/goals with staff: Yes Medical problems stabilized or resolved: Yes Denies suicidal/homicidal ideation: Yes Issues/concerns per patient self-inventory: None Other: N/A  New problem(s) identified: None identified at this time.   New Short Term/Long Term Goal(s): None identified at this time.   Discharge Plan or Barriers:  States he will live on the streets of [place] and find a job while following up at [facility] for Continuation of Hospitalization   Medication stabilization   Estimated Length of Stay: 1-3 days  Attendees: Patient: 11/24/2015  8:37 AM  Physician: Ursula Alert, MD 11/24/2015  8:37 AM  Nursing: Hoy Register, RN 11/24/2015  8:37 AM  RN Care Manager: Lars Pinks, RN 11/24/2015  8:37 AM  Social Worker: Ripley Fraise 11/24/2015  8:37 AM  Recreational Therapist: Laretta Bolster  11/24/2015  8:37 AM  Other: Norberto Sorenson 11/24/2015  8:37 AM  Other:  11/24/2015  8:37 AM    Scribe for Treatment Team:  Roque Lias 11/24/2015 8:37 AM

## 2015-11-24 NOTE — Progress Notes (Signed)
Recreation Therapy Notes  Date: 11/24/15 Time: 1000 Location: 500 Hall Dayroom  Group Topic: Leisure Education  Goal Area(s) Addresses:  Patient will identify positive leisure activities.  Patient will identify one positive benefit of participation in leisure activities.   Behavioral Response: Engaged   Intervention: 20 styrofoam/plastic cups, 2 soft sponge balls  Activity: Bowling.  LRT set up the cups so there would be 2 bowling "lanes".  LRT divided the group into teams.  Each person would get two chances to knock down as many "pins" as possible.  Each cup that got knocked down equalled one point.  The team that reached the top score first won.  Education:  Leisure Education, Building control surveyorDischarge Planning  Education Outcome: Acknowledges education/In group clarification offered/Needs additional education  Clinical Observations/Feedback: Pt was active, social and seemed excited during group.  Pt was appropriately competitive with his peers.  Pt also helped his peers.  Pt stated that during the activity the group had to use "concentration, coordination and focus".  Pt also stated he was going to play this activity with his kids.    Caroll RancherMarjette Roark Rufo, LRT/CTRS     Caroll RancherLindsay, Cesily Cuoco A 11/24/2015 12:55 PM

## 2015-11-24 NOTE — [facility] Group Notes (Signed)
[facility] LCSW Group Therapy  11/24/2015 1:15 pm  Type of Therapy: Process Group Therapy  Participation Level:  Active  Participation Quality:  Appropriate  Affect:  Flat  Cognitive:  Oriented  Insight:  Improving  Engagement in Group:  Limited  Engagement in Therapy:  Limited  Modes of Intervention:  Activity, Clarification, Education, Problem-solving and Support  Summary of Progress/Problems: Today's group addressed the issue of overcoming obstacles.  Patients were asked to identify their biggest obstacle post d/c that stands in the way of their on-going success, and then problem solve as to how to manage this. Stayed the entire time, engaged throughout.  Identified his biggest obstacle as "my addiction," and talked about people in his neighborhood hanging out on the corner and drinking.  "I need to change my network."  Talked about art galleries, [employer] and [employer] meetings as places where he could find positive people.  Shared a poem entitled "Beautiful moments in life" that was very nice, moving.    Daryel Geraldorth, Tavarius Grewe B 11/24/2015   2:41 PM

## 2015-11-24 NOTE — Progress Notes (Signed)
Patient has denied SI,HI and AVH this shift.  Patient has been compliant with medications and reported being in a good mood.  Patient plans to continue to treatment upon discharge and reports an increase in his mood.  Patient has made effective use of his coping skills and has had no behavioral dyscontrol.   Assess patient for safety, offer medications as prescribed, continue to monitor for 1:1 staff talks.   Patient able to contract for safety.

## 2015-11-24 NOTE — Progress Notes (Addendum)
[facility] MD Progress Note  11/24/2015 3:34 PM Robert Hurley  MRN:  [id number] Subjective: Patient states " I feel better, I am still not sleeping well."   Objective: Robert Hurley is a 63 y old single , AAM who lives in [place] , [place], has a hx of alcohol use disorder and depression, presented to [facility] brought in by EMS for psychosis/AMS.  Patient seen and chart reviewed.Discussed patient with treatment team.  Patient today was alert and oriented . Pt continues to be delusional and requires redirection on the unit. Pt is motivated to get help with his alcohol abuse , wants to go to a substance abuse program. Pt with sleep issues - will readjust medications. Per staff - pt continues to need encouragement and support.      Principal Problem: MDD (major depressive disorder), recurrent, severe, with psychosis (HCC) Diagnosis:   Patient Active Problem List   Diagnosis Date Noted  . MDD (major depressive disorder), recurrent, severe, with psychosis (HCC) [F33.3] 11/21/2015  . Cocaine use disorder, mild, abuse [F14.10] 11/21/2015  . Alcohol use disorder, severe, dependence (HCC) [F10.20] 11/20/2015    Class: Acute  . Alcohol intoxication (HCC) [F10.129]   . Syncope [R55] 09/24/2015  . Alcohol withdrawal (HCC) [F10.239] 09/23/2015  . Altered mental status [R41.82] 09/23/2015   Total Time spent with patient: 25 minutes  Past Psychiatric History: Please see H&P.   Past Medical History: Please see H&P.  Family History: Please see H&P.  Family Psychiatric  History: Mother's side of family has mental illness, aunts and uncles have alcoholism. Social History: Please see H&P.  History  Alcohol use Not on file    Comment: 14 gallons per week (2 gallons per day)     History  Drug Use    Social History   Social History  . Marital status: Single    Spouse name: N/A  . Number of children: N/A  . Years of education: N/A   Social History Main Topics  . Smoking status: Current Every Day  Smoker    Packs/day: 1.00    Types: Cigarettes  . Smokeless tobacco: Never Used  . Alcohol use None     Comment: 14 gallons per week (2 gallons per day)  . Drug use:   . Sexual activity: Not Asked   Other Topics Concern  . None   Social History Narrative  . None   Additional Social History:    Pain Medications: See MAR Prescriptions: See MAR Over the Counter: See MAR History of alcohol / drug use?: Yes Longest period of sobriety (when/how long): 5 years Negative Consequences of Use: Financial, Personal relationships Withdrawal Symptoms: Tremors, Other (Comment) (headaches) Name of Substance 1: Alcohol 1 - Age of First Use: 22 1 - Amount (size/oz): 2 gallons per day 1 - Frequency: daily 1 - Duration: Last two years 1 - Last Use / Amount: 11/18/15 pt reports consuming 1/5 th of liquor                  Sleep: Poor  Appetite:  Fair  Current Medications: Current Facility-Administered Medications  Medication Dose Route Frequency Provider Last Rate Last Dose  . acetaminophen (TYLENOL) tablet 650 mg  650 mg Oral Q6H PRN Shuvon B Rankin, NP   650 mg at 11/23/15 2220  . alum & mag hydroxide-simeth (MAALOX/MYLANTA) 200-200-20 MG/5ML suspension 30 mL  30 mL Oral Q4H PRN Shuvon B Rankin, NP      . ARIPiprazole (ABILIFY) tablet 10 mg  10  mg Oral Daily Jomarie LongsSaramma Neveen Daponte, MD   10 mg at 11/24/15 0835  . benztropine (COGENTIN) tablet 0.5 mg  0.5 mg Oral Daily Jomarie LongsSaramma Jakaya Jacobowitz, MD   0.5 mg at 11/24/15 0835  . chlordiazePOXIDE (LIBRIUM) capsule 25 mg  25 mg Oral QID PRN Jomarie LongsSaramma Linzy Darling, MD      . diphenhydrAMINE (BENADRYL) injection 25 mg  25 mg Intramuscular Q8H PRN Jomarie LongsSaramma Kamla Skilton, MD       Or  . diphenhydrAMINE (BENADRYL) capsule 25 mg  25 mg Oral Q8H PRN Massey Ruhland, MD      . haloperidol (HALDOL) tablet 5 mg  5 mg Oral Q8H PRN Jomarie LongsSaramma Knight Oelkers, MD       Or  . haloperidol lactate (HALDOL) injection 5 mg  5 mg Intramuscular Q8H PRN Makailey Hodgkin, MD      . magnesium hydroxide (MILK  OF MAGNESIA) suspension 30 mL  30 mL Oral Daily PRN Shuvon B Rankin, NP      . multivitamin with minerals tablet 1 tablet  1 tablet Oral Daily Sanjuana KavaAgnes I Nwoko, NP   1 tablet at 11/24/15 0835  . nicotine (NICODERM CQ - dosed in mg/24 hours) patch 21 mg  21 mg Transdermal Daily Oneta Rackanika N Lewis, NP   21 mg at 11/24/15 0835  . sertraline (ZOLOFT) tablet 25 mg  25 mg Oral Daily Jomarie LongsSaramma Cicilia Clinger, MD   25 mg at 11/24/15 0835  . thiamine (VITAMIN B-1) tablet 100 mg  100 mg Oral Daily Craige CottaFernando A Cobos, MD   100 mg at 11/24/15 0835  . traZODone (DESYREL) tablet 50 mg  50 mg Oral QHS PRN Kristeen MansFran E Hobson, NP   50 mg at 11/23/15 2220    Lab Results:  Results for orders placed or performed during the hospital encounter of 11/19/15 (from the past 48 hour(s))  TSH     Status: None   Collection Time: 11/23/15  6:55 AM  Result Value Ref Range   TSH 0.767 0.350 - 4.500 uIU/mL    Comment: [facility]  Lipid panel     Status: None   Collection Time: 11/23/15  6:55 AM  Result Value Ref Range   Cholesterol 177 0 - 200 mg/dL   Triglycerides 52 <782<150 mg/dL   HDL 93 >95>40 mg/dL   Total CHOL/HDL Ratio 1.9 RATIO   VLDL 10 0 - 40 mg/dL   LDL Cholesterol 74 0 - 99 mg/dL    Comment:        Total Cholesterol/HDL:CHD Risk Coronary Heart Disease Risk Table                     Men   Women  1/2 Average Risk   3.4   3.3  Average Risk       5.0   4.4  2 X Average Risk   9.6   7.1  3 X Average Risk  23.4   11.0        Use the calculated Patient Ratio above and the CHD Risk Table to determine the patient's CHD Risk.        ATP III CLASSIFICATION (LDL):  <100     mg/dL   Optimal  [id number]  mg/dL   Near or Above                    Optimal  130-159  mg/dL   Borderline  [id number]  mg/dL   High  >962>190     mg/dL  Very High Performed at [facility]   Hemoglobin A1c     Status: None   Collection Time: 11/23/15  6:55 AM  Result Value Ref Range   Hgb A1c MFr Bld 5.0 4.8 - 5.6 %    Comment:  (NOTE)         Pre-diabetes: 5.7 - 6.4         Diabetes: >6.4         Glycemic control for adults with diabetes: <7.0    Mean Plasma Glucose 97 mg/dL    Comment: (NOTE) Performed At: [facility] [address] [place], [place] [id number] Mila Homer MD LK:[id number] Performed at [facility]     Blood Alcohol level:  Lab Results  Component Value Date   ETH 167 (H) 11/18/2015   ETH 258 (H) 09/23/2015    Metabolic Disorder Labs: Lab Results  Component Value Date   HGBA1C 5.0 11/23/2015   MPG 97 11/23/2015   No results found for: PROLACTIN Lab Results  Component Value Date   CHOL 177 11/23/2015   TRIG 52 11/23/2015   HDL 93 11/23/2015   CHOLHDL 1.9 11/23/2015   VLDL 10 11/23/2015   LDLCALC 74 11/23/2015    Physical Findings: AIMS: Facial and Oral Movements Muscles of Facial Expression: None, normal Lips and Perioral Area: None, normal Jaw: None, normal Tongue: None, normal,Extremity Movements Upper (arms, wrists, hands, fingers): None, normal Lower (legs, knees, ankles, toes): None, normal, Trunk Movements Neck, shoulders, hips: None, normal, Overall Severity Severity of abnormal movements (highest score from questions above): None, normal Incapacitation due to abnormal movements: None, normal Patient's awareness of abnormal movements (rate only patient's report): No Awareness, Dental Status Current problems with teeth and/or dentures?: No Does patient usually wear dentures?: No  CIWA:  CIWA-Ar Total: 3 COWS:  COWS Total Score: 3  Musculoskeletal: Strength & Muscle Tone: within normal limits Gait & Station: normal Patient leans: N/A  Psychiatric Specialty Exam: Physical Exam  Nursing note and vitals reviewed.   Review of Systems  Psychiatric/Behavioral: Positive for depression and substance abuse. The patient is nervous/anxious.   All other systems reviewed and are negative.   Blood pressure 129/86, pulse 100,  temperature 98.5 F (36.9 C), temperature source Oral, resp. rate 18, height 5\' 8"  (1.727 m), weight 79.8 kg (176 lb), SpO2 100 %.Body mass index is 26.76 kg/m.  General Appearance: Disheveled  Eye Contact:  Fair  Speech:  Normal Rate  Volume:  Decreased  Mood:  Anxious  Affect:  Appropriate and Congruent  Thought Process:  Goal Directed and Descriptions of Associations: Circumstantial  Orientation:  Other:  person, place  Thought Content:  Delusions, Paranoid Ideation and Rumination improving  Suicidal Thoughts:  No  Homicidal Thoughts:  denies - but is a danger to self or others due to paranoia  Memory:  Immediate;   Fair Recent;   Fair Remote;   Poor  Judgement:  Impaired  Insight:  Shallow  Psychomotor Activity:  Restlessness  Concentration:  Concentration: Fair and Attention Span: Fair  Recall:  [employer] of Knowledge:  Fair  Language:  Fair  Akathisia:  No  Handed:  Right  AIMS (if indicated):     Assets:  Desire for Improvement  ADL's:  Intact  Cognition:  WNL  Sleep:  Number of Hours: 6.25     Treatment Plan Summary:Brady is a 29 y old single , AAM who lives in [place] , [place], has a hx of  alcohol use disorder and depression, presented to [facility] brought in by EMS for psychosis/AMS.  Patient today continues to be delusional , grandiose, paranoid- will continue treatment.  Daily contact with patient to assess and evaluate symptoms and progress in treatment and Medication management   Will continue  Abilify 10 mg po qhs for psychosis. Will continue Zoloft 25 mg po daily for affective sx. Will increase Trazodone to 75 mg po qhs for sleep. Will continue CIWA/Librium protocol for alcohol withdrawal sx. Will make available PRN medications as per agitation protocol. Reviewed past medical records,treatment plan.  Will continue to monitor vitals ,medication compliance and treatment side effects while patient is here.  Will monitor for medical issues as well as call  consult as needed.  Reviewed ekg for qtc - WNL, TSH WNL, lipid panel-WNL, hba1c -wnl , ordered prolactin. CSW will continue working on disposition. Pt to be referred to substance abuse treatment program.He is motivated to do so. Patient to participate in therapeutic milieu .   Annjanette Wertenberger, MD  11/24/2015, 3:34 PM

## 2015-11-24 NOTE — Progress Notes (Signed)
Patient ID: Karn PicklerMichael E Wigen, male   DOB: Nov 08, 1970, 45 y.o.   MRN: [id number] D: client is visible on the unit, suspicious, but eventually talks to Clinical research associatewriter. Client reports "I got lots of music, have songs written" "thank God momma kept them put up for me" "you see I'm an alcoholic" "my family don't understand I need them" "they don't think I have a problem, they says just pray, but I need their help, their encouragement" "I give to them but they won't give to me" "only when I can help them, give them money"  Client continues to talk about music and record deals. "I want my own choir" "peoople don't understand when I tell them if you don't practice you cant' sing, then they say you ain't saved" "I had money, I helped my family out, but they won't help me out" client continues to be grandiose and tangential. A: Writer is emotionally supportive, encouraged client to continue to use music talents and follow through with detox. Staff will monitor q515min for safety. R: client is safe on the unit, attended and sang.

## 2015-11-25 LAB — PROLACTIN: Prolactin: 1.8 ng/mL — ABNORMAL LOW (ref 4.0–15.2)

## 2015-11-25 MED ORDER — ACAMPROSATE CALCIUM 333 MG PO TBEC
666.0000 mg | DELAYED_RELEASE_TABLET | Freq: Three times a day (TID) | ORAL | Status: DC
  Administered 2015-11-25 – 2015-11-28 (×9): 666 mg via ORAL
  Filled 2015-11-25 (×15): qty 2

## 2015-11-25 MED ORDER — BISMUTH SUBSALICYLATE 262 MG PO CHEW
2.0000 | CHEWABLE_TABLET | ORAL | Status: DC | PRN
  Administered 2015-11-25 (×2): 524 mg via ORAL
  Filled 2015-11-25 (×3): qty 2

## 2015-11-25 MED ORDER — DOXEPIN HCL 10 MG PO CAPS
10.0000 mg | ORAL_CAPSULE | Freq: Every day | ORAL | Status: DC
  Administered 2015-11-25 – 2015-11-27 (×3): 10 mg via ORAL
  Filled 2015-11-25 (×5): qty 1

## 2015-11-25 NOTE — Progress Notes (Signed)
Recreation Therapy Notes  Date: 11/25/15 Time: 1000 Location: 500 Hall Dayroom  Group Topic: Stress Management  Goal Area(s) Addresses:  Patient will verbalize importance of using healthy stress management.  Patient will identify positive emotions associated with healthy stress management.   Intervention: Stress Management  Activity :  Deep Breathing, Depression Imagery.  LRT introduced patients to the techniques of deep breathing and guided imagery.  LRT read scripts to allow patients to participate in the techniques.  Patients were to listen and follow along as LRT read scripts.  Education:  Stress Management, Discharge Planning.   Education Outcome: Acknowledges edcuation/In group clarification offered/Needs additional education  Clinical Observations/Feedback:  Pt did not attend group.   Clarence Dunsmore, LRT/CTRS         Zaylon Bossier A 11/25/2015 11:55 AM 

## 2015-11-25 NOTE — Progress Notes (Signed)
[facility] MD Progress Note  11/25/2015 11:05 AM ALWYN CORDNER  MRN:  [id number] Subjective: Patient states " I could not sleep last night. I have some diarrhea , I am not sure why . "    Objective: Nedim is a 67 y old single , AAM who lives in [place] , [place], has a hx of alcohol use disorder and depression, presented to [facility] brought in by EMS for psychosis/AMS.  Patient seen and chart reviewed.Discussed patient with treatment team.  Patient today was alert and oriented . Pt continues to be delusional and requires redirection on the unit on and off. Pt also reports sleep issues - woke up several times last night . Discussed changing his Trazodone , since he is not tolerating it well, gives him nightmares. Pt also with some diarrhea - likely from withdrawal for alcohol. Pt is motivated to get help with his alcohol abuse , wants to go to a substance abuse program. Per staff - pt continues to need encouragement and support.      Principal Problem: MDD (major depressive disorder), recurrent, severe, with psychosis (HCC) Diagnosis:   Patient Active Problem List   Diagnosis Date Noted  . MDD (major depressive disorder), recurrent, severe, with psychosis (HCC) [F33.3] 11/21/2015  . Cocaine use disorder, mild, abuse [F14.10] 11/21/2015  . Alcohol use disorder, severe, dependence (HCC) [F10.20] 11/20/2015    Class: Acute  . Alcohol intoxication (HCC) [F10.129]   . Syncope [R55] 09/24/2015  . Alcohol withdrawal (HCC) [F10.239] 09/23/2015  . Altered mental status [R41.82] 09/23/2015   Total Time spent with patient: 25 minutes  Past Psychiatric History: Please see H&P.   Past Medical History: Please see H&P.  Family History: Please see H&P.  Family Psychiatric  History: Mother's side of family has mental illness, aunts and uncles have alcoholism. Social History: Please see H&P.  History  Alcohol use Not on file    Comment: 14 gallons per week (2 gallons per day)     History   Drug Use    Social History   Social History  . Marital status: Single    Spouse name: N/A  . Number of children: N/A  . Years of education: N/A   Social History Main Topics  . Smoking status: Current Every Day Smoker    Packs/day: 1.00    Types: Cigarettes  . Smokeless tobacco: Never Used  . Alcohol use None     Comment: 14 gallons per week (2 gallons per day)  . Drug use:   . Sexual activity: Not Asked   Other Topics Concern  . None   Social History Narrative  . None   Additional Social History:    Pain Medications: See MAR Prescriptions: See MAR Over the Counter: See MAR History of alcohol / drug use?: Yes Longest period of sobriety (when/how long): 5 years Negative Consequences of Use: Financial, Personal relationships Withdrawal Symptoms: Tremors, Other (Comment) (headaches) Name of Substance 1: Alcohol 1 - Age of First Use: 22 1 - Amount (size/oz): 2 gallons per day 1 - Frequency: daily 1 - Duration: Last two years 1 - Last Use / Amount: 11/18/15 pt reports consuming 1/5 th of liquor                  Sleep: Poor  Appetite:  Fair  Current Medications: Current Facility-Administered Medications  Medication Dose Route Frequency Provider Last Rate Last Dose  . acamprosate (CAMPRAL) tablet 666 mg  666 mg Oral TID WC Jarell Mcewen,  MD      . acetaminophen (TYLENOL) tablet 650 mg  650 mg Oral Q6H PRN Shuvon B Rankin, NP   650 mg at 11/23/15 2220  . alum & mag hydroxide-simeth (MAALOX/MYLANTA) 200-200-20 MG/5ML suspension 30 mL  30 mL Oral Q4H PRN Shuvon B Rankin, NP   30 mL at 11/24/15 2141  . ARIPiprazole (ABILIFY) tablet 10 mg  10 mg Oral Daily Jomarie LongsSaramma Rima Blizzard, MD   10 mg at 11/25/15 0755  . benztropine (COGENTIN) tablet 0.5 mg  0.5 mg Oral Daily Jomarie LongsSaramma Mattye Verdone, MD   0.5 mg at 11/25/15 0755  . bismuth subsalicylate (PEPTO BISMOL) 262 MG/15ML suspension 30 mL  30 mL Oral Q4H PRN Sanjuana KavaAgnes I Nwoko, NP      . chlordiazePOXIDE (LIBRIUM) capsule 25 mg  25 mg Oral  QID PRN Jomarie LongsSaramma Thetis Schwimmer, MD   25 mg at 11/24/15 2144  . diphenhydrAMINE (BENADRYL) injection 25 mg  25 mg Intramuscular Q8H PRN Jomarie LongsSaramma Stanislawa Gaffin, MD       Or  . diphenhydrAMINE (BENADRYL) capsule 25 mg  25 mg Oral Q8H PRN Jomarie LongsSaramma Tayra Dawe, MD      . doxepin (SINEQUAN) capsule 10 mg  10 mg Oral QHS Antone Summons, MD      . haloperidol (HALDOL) tablet 5 mg  5 mg Oral Q8H PRN Jomarie LongsSaramma Elysha Daw, MD       Or  . haloperidol lactate (HALDOL) injection 5 mg  5 mg Intramuscular Q8H PRN Suheyla Mortellaro, MD      . magnesium hydroxide (MILK OF MAGNESIA) suspension 30 mL  30 mL Oral Daily PRN Shuvon B Rankin, NP      . multivitamin with minerals tablet 1 tablet  1 tablet Oral Daily Sanjuana KavaAgnes I Nwoko, NP   1 tablet at 11/25/15 0755  . nicotine (NICODERM CQ - dosed in mg/24 hours) patch 21 mg  21 mg Transdermal Daily Oneta Rackanika N Lewis, NP   21 mg at 11/24/15 0835  . sertraline (ZOLOFT) tablet 25 mg  25 mg Oral Daily Jomarie LongsSaramma Myrtle Barnhard, MD   25 mg at 11/25/15 0755  . thiamine (VITAMIN B-1) tablet 100 mg  100 mg Oral Daily Craige CottaFernando A Cobos, MD   100 mg at 11/25/15 [id number]    Lab Results:  Results for orders placed or performed during the hospital encounter of 11/19/15 (from the past 48 hour(s))  Prolactin     Status: Abnormal   Collection Time: 11/24/15  6:51 AM  Result Value Ref Range   Prolactin 1.8 (L) 4.0 - 15.2 ng/mL    Comment: (NOTE) Performed At: [facility] [address] [place], [place] [id number] Mila HomerHancock William F MD JX:[id number] Performed at [facility]     Blood Alcohol level:  Lab Results  Component Value Date   ETH 167 (H) 11/18/2015   ETH 258 (H) 09/23/2015    Metabolic Disorder Labs: Lab Results  Component Value Date   HGBA1C 5.0 11/23/2015   MPG 97 11/23/2015   Lab Results  Component Value Date   PROLACTIN 1.8 (L) 11/24/2015   Lab Results  Component Value Date   CHOL 177 11/23/2015   TRIG 52 11/23/2015   HDL 93 11/23/2015   CHOLHDL 1.9 11/23/2015   VLDL 10  11/23/2015   LDLCALC 74 11/23/2015    Physical Findings: AIMS: Facial and Oral Movements Muscles of Facial Expression: None, normal Lips and Perioral Area: None, normal Jaw: None, normal Tongue: None, normal,Extremity Movements Upper (arms, wrists, hands, fingers): None, normal Lower (legs, knees,  ankles, toes): None, normal, Trunk Movements Neck, shoulders, hips: None, normal, Overall Severity Severity of abnormal movements (highest score from questions above): None, normal Incapacitation due to abnormal movements: None, normal Patient's awareness of abnormal movements (rate only patient's report): No Awareness, Dental Status Current problems with teeth and/or dentures?: No Does patient usually wear dentures?: No  CIWA:  CIWA-Ar Total: 0 COWS:  COWS Total Score: 3  Musculoskeletal: Strength & Muscle Tone: within normal limits Gait & Station: normal Patient leans: N/A  Psychiatric Specialty Exam: Physical Exam  Nursing note and vitals reviewed.   Review of Systems  Gastrointestinal: Positive for diarrhea.  Psychiatric/Behavioral: Positive for depression and substance abuse. The patient is nervous/anxious and has insomnia.   All other systems reviewed and are negative.   Blood pressure 101/68, pulse 97, temperature 98.6 F (37 C), temperature source Oral, resp. rate 18, height 5\' 8"  (1.727 m), weight 79.8 kg (176 lb), SpO2 100 %.Body mass index is 26.76 kg/m.  General Appearance: Disheveled  Eye Contact:  Fair  Speech:  Normal Rate  Volume:  Decreased  Mood:  Anxious  Affect:  Appropriate and Congruent  Thought Process:  Goal Directed and Descriptions of Associations: Circumstantial  Orientation:  Other:  person, place  Thought Content:  Delusions, Paranoid Ideation and Rumination improving  Suicidal Thoughts:  No  Homicidal Thoughts:  denies - but is a danger to self or others due to paranoia  Memory:  Immediate;   Fair Recent;   Fair Remote;   Poor  Judgement:   Impaired  Insight:  Shallow  Psychomotor Activity:  Restlessness  Concentration:  Concentration: Fair and Attention Span: Fair  Recall:  [employer] of Knowledge:  Fair  Language:  Fair  Akathisia:  No  Handed:  Right  AIMS (if indicated):     Assets:  Desire for Improvement  ADL's:  Intact  Cognition:  WNL  Sleep:  Number of Hours: 6.75     Treatment Plan Summary:Jakyle is a 18 y old single , AAM who lives in [place] , [place], has a hx of alcohol use disorder and depression, presented to [facility] brought in by EMS for psychosis/AMS.  Patient today continues to be delusional , and has sleep issues , will readjust medications.   Daily contact with patient to assess and evaluate symptoms and progress in treatment and Medication management   Will continue  Abilify 10 mg po qhs for psychosis. Will continue Zoloft 25 mg po daily for affective sx. Will discontinue Trazodone for side effects - will add Doxepin 10 mg po qhs for sleep. Will continue CIWA/Librium protocol for alcohol withdrawal sx. Will offer symptomatic treatment for diarrhea. Will make available PRN medications as per agitation protocol. Reviewed past medical records,treatment plan.  Will continue to monitor vitals ,medication compliance and treatment side effects while patient is here.  Will monitor for medical issues as well as call consult as needed.  Reviewed ekg for qtc - WNL, TSH WNL, lipid panel-WNL, hba1c -wnl , prolactin- 1.8. CSW will continue working on disposition. Pt to be referred to substance abuse treatment program.He is motivated to do so. Patient to participate in therapeutic milieu .   Vartan Kerins, MD  11/25/2015, 11:05 AM

## 2015-11-25 NOTE — Progress Notes (Signed)
Patient has denied SI,HI and AVH this shift.  Patient has been compliant with medications and reported being in a good mood.  Patient plans to continue to treatment upon discharge and reports an increase in his mood.  Patient has made effective use of his coping skills and has had no behavioral dyscontrol. Patient has had several incidents of upset stomach and diarrhea this shift.   Assess patient for safety, offer medications as prescribed, continue to monitor for 1:1 staff talks.   Patient able to contract for safety.

## 2015-11-25 NOTE — [facility] Group Notes (Signed)
Type of Therapy:  Group therapy  Participation Level:  Active  Participation Quality:  Attentive  Affect:  Flat  Cognitive:  Oriented  Insight:  Limited  Engagement in Therapy:  Limited  Modes of Intervention:  Discussion, Socialization  Summary of Progress/Problems:  Chaplain was here to lead a group on themes of hope and courage. Casimiro NeedleMichael was invited to group session, chose not to attend.

## 2015-11-25 NOTE — Progress Notes (Signed)
Patient ID: Robert Hurley, male   DOB: 1971-03-06, 45 y.o.   MRN: [id number] D: Client visible on the unit reports day been "rough" "my stomach been messed up, thought I was through with detox" A: Writer provided emotional support, encouraged client to report any further loose stools. Pepto Bismol tabs administered after one loose stool. Client later got up for crackers and gator aid. Staff will monitor q6415min for safety. R: client is safe on the unit.

## 2015-11-26 NOTE — Progress Notes (Signed)
Patient ID: Robert PicklerMichael E Hurley, male   DOB: November 13, 1970, 45 y.o.   MRN: [id number] [facility] Progress Note  11/26/2015 4:00 PM Robert PicklerMichael E Hurley  MRN:  [id number] Subjective: Patient states " I am feeling good and taking my mediation and no disturbance of sleep and appetite".   Objective: Robert Hurley is a 89 y old single , AAM who lives in [place] , [place], has a hx of alcohol use disorder and depression, [facility] for psychosis/AMS.  Patient seen and chart reviewed.Discussed patient with treatment team.  Patient today was alert and oriented. He has poor eye contact and looking away from the provider. Pt continues to be delusional and requires redirection on the unit on and off. Pt stated that he is sleeping well since his medication has changed. He denied diarrhea -and no withdrawal for alcohol. Pt is motivated to get help with his alcohol abuse , wants to go to a substance abuse program.  Per staff - pt continues to need encouragement and support.  Principal Problem: MDD (major depressive disorder), recurrent, severe, with psychosis (HCC) Diagnosis:   Patient Active Problem List   Diagnosis Date Noted  . MDD (major depressive disorder), recurrent, severe, with psychosis (HCC) [F33.3] 11/21/2015  . Cocaine use disorder, mild, abuse [F14.10] 11/21/2015  . Alcohol use disorder, severe, dependence (HCC) [F10.20] 11/20/2015    Class: Acute  . Alcohol intoxication (HCC) [F10.129]   . Syncope [R55] 09/24/2015  . Alcohol withdrawal (HCC) [F10.239] 09/23/2015  . Altered mental status [R41.82] 09/23/2015   Total Time spent with patient: 25 minutes  Past Psychiatric History: Please see H&P.   Past Medical History: Please see H&P.  Family History: Please see H&P.  Family Psychiatric  History: Mother's side of family has mental illness, aunts and uncles have alcoholism. Social History: Please see H&P.  History  Alcohol use Not on file    Comment: 14 gallons per week (2  gallons per day)     History  Drug Use    Social History   Social History  . Marital status: Single    Spouse name: N/A  . Number of children: N/A  . Years of education: N/A   Social History Main Topics  . Smoking status: Current Every Day Smoker    Packs/day: 1.00    Types: Cigarettes  . Smokeless tobacco: Never Used  . Alcohol use None     Comment: 14 gallons per week (2 gallons per day)  . Drug use:   . Sexual activity: Not Asked   Other Topics Concern  . None   Social History Narrative  . None   Additional Social History:    Pain Medications: See MAR Prescriptions: See MAR Over the Counter: See MAR History of alcohol / drug use?: Yes Longest period of sobriety (when/how long): 5 years Negative Consequences of Use: Financial, Personal relationships Withdrawal Symptoms: Tremors, Other (Comment) (headaches) Name of Substance 1: Alcohol 1 - Age of First Use: 22 1 - Amount (size/oz): 2 gallons per day 1 - Frequency: daily 1 - Duration: Last two years 1 - Last Use / Amount: 11/18/15 pt reports consuming 1/5 th of liquor       Sleep: Poor  Appetite:  Fair  Current Medications: Current Facility-Administered Medications  Medication Dose Route Frequency Provider Last Rate Last Dose  . acamprosate (CAMPRAL) tablet 666 mg  666 mg Oral TID WC Jomarie LongsSaramma Eappen, MD   666 mg at 11/26/15 0626  . acetaminophen (  TYLENOL) tablet 650 mg  650 mg Oral Q6H PRN Shuvon B Rankin, NP   650 mg at 11/23/15 2220  . alum & mag hydroxide-simeth (MAALOX/MYLANTA) 200-200-20 MG/5ML suspension 30 mL  30 mL Oral Q4H PRN Shuvon B Rankin, NP   30 mL at 11/24/15 2141  . ARIPiprazole (ABILIFY) tablet 10 mg  10 mg Oral Daily Jomarie Longs, MD   10 mg at 11/26/15 0829  . benztropine (COGENTIN) tablet 0.5 mg  0.5 mg Oral Daily Jomarie Longs, MD   0.5 mg at 11/26/15 0829  . bismuth subsalicylate (PEPTO BISMOL) chewable tablet 524 mg  2 tablet Oral Q4H PRN Sanjuana Kava, NP   524 mg at 11/25/15 2035   . chlordiazePOXIDE (LIBRIUM) capsule 25 mg  25 mg Oral QID PRN Jomarie Longs, MD   25 mg at 11/24/15 2144  . diphenhydrAMINE (BENADRYL) injection 25 mg  25 mg Intramuscular Q8H PRN Jomarie Longs, MD       Or  . diphenhydrAMINE (BENADRYL) capsule 25 mg  25 mg Oral Q8H PRN Jomarie Longs, MD      . doxepin (SINEQUAN) capsule 10 mg  10 mg Oral QHS Saramma Eappen, MD   10 mg at 11/25/15 2200  . haloperidol (HALDOL) tablet 5 mg  5 mg Oral Q8H PRN Jomarie Longs, MD       Or  . haloperidol lactate (HALDOL) injection 5 mg  5 mg Intramuscular Q8H PRN Saramma Eappen, MD      . magnesium hydroxide (MILK OF MAGNESIA) suspension 30 mL  30 mL Oral Daily PRN Shuvon B Rankin, NP      . multivitamin with minerals tablet 1 tablet  1 tablet Oral Daily Sanjuana Kava, NP   1 tablet at 11/26/15 [phone]  . nicotine (NICODERM CQ - dosed in mg/24 hours) patch 21 mg  21 mg Transdermal Daily Oneta Rack, NP   21 mg at 11/24/15 0835  . sertraline (ZOLOFT) tablet 25 mg  25 mg Oral Daily Jomarie Longs, MD   25 mg at 11/26/15 0828  . thiamine (VITAMIN B-1) tablet 100 mg  100 mg Oral Daily Craige Cotta, MD   100 mg at 11/26/15 9562    Lab Results:  No results found for this or any previous visit (from the past 48 hour(s)).  Blood Alcohol level:  Lab Results  Component Value Date   ETH 167 (H) 11/18/2015   ETH 258 (H) 09/23/2015    Metabolic Disorder Labs: Lab Results  Component Value Date   HGBA1C 5.0 11/23/2015   MPG 97 11/23/2015   Lab Results  Component Value Date   PROLACTIN 1.8 (L) 11/24/2015   Lab Results  Component Value Date   CHOL 177 11/23/2015   TRIG 52 11/23/2015   HDL 93 11/23/2015   CHOLHDL 1.9 11/23/2015   VLDL 10 11/23/2015   LDLCALC 74 11/23/2015    Physical Findings: AIMS: Facial and Oral Movements Muscles of Facial Expression: None, normal Lips and Perioral Area: None, normal Jaw: None, normal Tongue: None, normal,Extremity Movements Upper (arms, wrists, hands,  fingers): None, normal Lower (legs, knees, ankles, toes): None, normal, Trunk Movements Neck, shoulders, hips: None, normal, Overall Severity Severity of abnormal movements (highest score from questions above): None, normal Incapacitation due to abnormal movements: None, normal Patient's awareness of abnormal movements (rate only patient's report): No Awareness, Dental Status Current problems with teeth and/or dentures?: No Does patient usually wear dentures?: No  CIWA:  CIWA-Ar Total: 0 COWS:  COWS  Total Score: 3  Musculoskeletal: Strength & Muscle Tone: within normal limits Gait & Station: normal Patient leans: N/A  Psychiatric Specialty Exam: Physical Exam  Nursing note and vitals reviewed.   Review of Systems  Gastrointestinal: Positive for diarrhea.  Psychiatric/Behavioral: Positive for depression and substance abuse. The patient is nervous/anxious and has insomnia.   All other systems reviewed and are negative.   Blood pressure 96/67, pulse 92, temperature 98.2 F (36.8 C), temperature source Oral, resp. rate 18, height 5\' 8"  (1.727 m), weight 79.8 kg (176 lb), SpO2 100 %.Body mass index is 26.76 kg/m.  General Appearance: Disheveled  Eye Contact:  Fair  Speech:  Normal Rate  Volume:  Decreased  Mood:  Anxious  Affect:  Appropriate and Congruent  Thought Process:  Goal Directed and Descriptions of Associations: Circumstantial  Orientation:  Other:  person, place  Thought Content:  Delusions, Paranoid Ideation and Rumination improving  Suicidal Thoughts:  No  Homicidal Thoughts:  denies - but is a danger to self or others due to paranoia  Memory:  Immediate;   Fair Recent;   Fair Remote;   Poor  Judgement:  Impaired  Insight:  Shallow  Psychomotor Activity:  Restlessness  Concentration:  Concentration: Fair and Attention Span: Fair  Recall:  [employer] of Knowledge:  Fair  Language:  Fair  Akathisia:  No  Handed:  Right  AIMS (if indicated):     Assets:   Desire for Improvement  ADL's:  Intact  Cognition:  WNL  Sleep:  Number of Hours: 6.75     Treatment Plan Summary: Donelle is a 45 y old single , AAM who lives in [place] , [place], has a hx of alcohol use disorder and depression, presented to [facility] brought in by EMS for psychosis/AMS.  Patient today continues to be delusional , and has sleep issues , will readjust medications.   Daily contact with patient to assess and evaluate symptoms and progress in treatment and Medication management   Will continue  Abilify 10 mg po qhs for psychosis. Will continue Zoloft 25 mg po daily for affective sx. Continue Doxepin 10 mg po qhs for sleep. Will continue CIWA/Librium protocol for alcohol withdrawal sx. Will offer symptomatic treatment for diarrhea. Will make available PRN medications as per agitation protocol. Reviewed past medical records,treatment plan.  Will continue to monitor vitals ,medication compliance and treatment side effects while patient is here.  Will monitor for medical issues as well as call consult as needed.  Reviewed ekg for qtc - WNL, TSH WNL, lipid panel-WNL, hba1c -wnl , prolactin- 1.8. CSW will continue working on disposition. Pt to be referred to substance abuse treatment program.He is motivated to do so. Patient to participate in therapeutic milieu .   Leata Mouse, MD  11/26/2015, 4:00 PM

## 2015-11-26 NOTE — Progress Notes (Signed)
Patient has denied SI,HI and AVH this shift. Patient has been compliant with medications and reported being in a good mood. Patient plans to continue to treatment upon discharge and reports an increase in his mood. Patient has made effective use of his coping skills and has had no behavioral dyscontrol. Patient had an issue with peer this shift and needed redirection Patient was able to calm and returned to a bright and happy mood.   Assess patient for safety, offer medications as prescribed, continue to monitor for 1:1 staff talks.   Patient able to contract for safety 

## 2015-11-26 NOTE — [facility] Group Notes (Signed)
[facility] Group Notes:  (Clinical Social Work)  11/26/2015  11:15-12:00PM  Summary of Progress/Problems:   Today's process group involved patients discussing their feelings related to being hospitalized, as well as how they can use their present feelings to create a plan for staying out of the hospital in the future. The patient expressed himself frequently and at length throughout group, but never really addressed questions asked.  He appeared delusional at times, grandiose, and tangential.  He stated at one point that he drinks 2 gallons of alcohol a day.  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

## 2015-11-26 NOTE — Progress Notes (Signed)
Adult Psychoeducational Group Note  Date:  11/26/2015 Time:  9:23 PM  Group Topic/Focus:  Wrap-Up Group:   The focus of this group is to help patients review their daily goal of treatment and discuss progress on daily workbooks.   Participation Level:  Active  Participation Quality:  Appropriate  Affect:  Appropriate  Cognitive:  Appropriate  Insight: Appropriate  Engagement in Group:  Engaged  Modes of Intervention:  Discussion  Additional Comments:  The patient expressed that he attended groups.The patient also said that his day was fine. Octavio Mannshigpen, Asiya Cutbirth Lee 11/26/2015, 9:23 PM

## 2015-11-27 NOTE — Progress Notes (Signed)
Patient has been up and active on the unit, attended group this evening and has voiced no complaints. He reports that he will be discharging on tomorrow and [facility]. He requested to go to [address] hall and play the piano which he did to help ease his anxiety.Patient currently denies having pain, -si/hi/a/v hall. Support and encouragement offered, safety maintained on unit, will continue to monitor.

## 2015-11-27 NOTE — Progress Notes (Signed)
D: Patient pleasant and cooperative with care and brightens on approach. Pt does have complaints of depression but denies suicidal ideations currently. Pt participated in evening wrap up group session and was noted to interact well with peers in the milieu. A: Q 15 minute safety checks, encourage staff/peer interaction and group participation; administer medications as ordered.  R: Pt compliant with HS medications; no signs or symptoms of distress noted.

## 2015-11-27 NOTE — Progress Notes (Signed)
[name] is doing well today . He laughs and jokes with staff and he completes his daily assessment. On this, he writes he deneis SI today and he rates his depression, hopelessness and anxiety " 0/0/1", respectively. A He attends his groups but minimmizes his issues and will not engage in serious conversation with this Clinical research associatewriter ( about his MH problems). R Safety in place.

## 2015-11-27 NOTE — Progress Notes (Signed)
[facility] Group Notes:  (Nursing/MHT/Case Management/Adjunct)  Date:  11/27/2015  Time:  9:50 PM  Type of Therapy:  Psychoeducational Skills  Participation Level:  Active  Participation Quality:  Attentive  Affect:  Flat  Cognitive:  Appropriate  Insight:  Appropriate  Engagement in Group:  Lacking  Modes of Intervention:  Education  Summary of Progress/Problems: The patient would only share with the group that he enjoyed watching football. No additional details were provided with regards to his day. As for the theme of the day, his support system will consist [facility] He states that he is an alcoholic.   Ciel Chervenak S 11/27/2015, 9:50 PM

## 2015-11-27 NOTE — Progress Notes (Signed)
[facility] MD Progress Note  11/27/2015 3:28 PM Robert Hurley  MRN:  [id number]   Subjective: Patient states " I am feeling good and taking my mediation and no disturbance of sleep and appetite and I suppose to be discharge tomorrow".   Objective: Robert Hurley is a 80 y old single , AAM who lives in [place] , [place], has a hx of alcohol use disorder and depression, presented to [facility] brought in by EMS for psychosis/AMS.  Patient seen and chart reviewed.Discussed patient with treatment team.  Patient has no complaints today and was alert and oriented. He has good eye contact and denied depression, craving for drugs, hallucinations, delusional and safety concerns. Pt stated that he is sleeping well since his medication has been working. He denied withdrawal for alcohol. Pt is motivated to get help with his alcohol abuse , wants to go to a substance abuse program.  Per staff - Patient has denied SI,HI and AVH this shift. Patient has been compliant with medications and reported being in a good mood. Patient plans to continue to treatment upon discharge and reports an increase in his mood. Patient has made effective use of his coping skills and has had no behavioral dyscontrol. Patient had an issue with peer this shift and needed redirection. Patient was able to calm and returned to a bright and happy mood.   Principal Problem: MDD (major depressive disorder), recurrent, severe, with psychosis (HCC) Diagnosis:   Patient Active Problem List   Diagnosis Date Noted  . MDD (major depressive disorder), recurrent, severe, with psychosis (HCC) [F33.3] 11/21/2015  . Cocaine use disorder, mild, abuse [F14.10] 11/21/2015  . Alcohol use disorder, severe, dependence (HCC) [F10.20] 11/20/2015    Class: Acute  . Alcohol intoxication (HCC) [F10.129]   . Syncope [R55] 09/24/2015  . Alcohol withdrawal (HCC) [F10.239] 09/23/2015  . Altered mental status [R41.82] 09/23/2015   Total Time spent with patient: 25  minutes  Past Psychiatric History: Please see H&P.   Past Medical History: Please see H&P.  Family History: Please see H&P.  Family Psychiatric  History: Mother's side of family has mental illness, aunts and uncles have alcoholism. Social History: Please see H&P.  History  Alcohol use Not on file    Comment: 14 gallons per week (2 gallons per day)     History  Drug Use    Social History   Social History  . Marital status: Single    Spouse name: N/A  . Number of children: N/A  . Years of education: N/A   Social History Main Topics  . Smoking status: Current Every Day Smoker    Packs/day: 1.00    Types: Cigarettes  . Smokeless tobacco: Never Used  . Alcohol use None     Comment: 14 gallons per week (2 gallons per day)  . Drug use:   . Sexual activity: Not Asked   Other Topics Concern  . None   Social History Narrative  . None   Additional Social History:    Pain Medications: See MAR Prescriptions: See MAR Over the Counter: See MAR History of alcohol / drug use?: Yes Longest period of sobriety (when/how long): 5 years Negative Consequences of Use: Financial, Personal relationships Withdrawal Symptoms: Tremors, Other (Comment) (headaches) Name of Substance 1: Alcohol 1 - Age of First Use: 22 1 - Amount (size/oz): 2 gallons per day 1 - Frequency: daily 1 - Duration: Last two years 1 - Last Use / Amount: 11/18/15 pt reports consuming 1/5 th of  liquor       Sleep: Good  Appetite:  Fair  Current Medications: Current Facility-Administered Medications  Medication Dose Route Frequency Provider Last Rate Last Dose  . acamprosate (CAMPRAL) tablet 666 mg  666 mg Oral TID WC Saramma Eappen, MD   666 mg at 11/27/15 1255  . acetaminophen (TYLENOL) tablet 650 mg  650 mg Oral Q6H PRN Shuvon B Rankin, NP   650 mg at 11/23/15 2220  . alum & mag hydroxide-simeth (MAALOX/MYLANTA) 200-200-20 MG/5ML suspension 30 mL  30 mL Oral Q4H PRN Shuvon B Rankin, NP   30 mL at  11/24/15 2141  . ARIPiprazole (ABILIFY) tablet 10 mg  10 mg Oral Daily Jomarie Longs, MD   10 mg at 11/27/15 0810  . benztropine (COGENTIN) tablet 0.5 mg  0.5 mg Oral Daily Jomarie Longs, MD   0.5 mg at 11/27/15 0810  . bismuth subsalicylate (PEPTO BISMOL) chewable tablet 524 mg  2 tablet Oral Q4H PRN Sanjuana Kava, NP   524 mg at 11/25/15 2035  . chlordiazePOXIDE (LIBRIUM) capsule 25 mg  25 mg Oral QID PRN Jomarie Longs, MD   25 mg at 11/24/15 2144  . diphenhydrAMINE (BENADRYL) injection 25 mg  25 mg Intramuscular Q8H PRN Jomarie Longs, MD       Or  . diphenhydrAMINE (BENADRYL) capsule 25 mg  25 mg Oral Q8H PRN Jomarie Longs, MD      . doxepin (SINEQUAN) capsule 10 mg  10 mg Oral QHS Jomarie Longs, MD   10 mg at 11/26/15 2039  . haloperidol (HALDOL) tablet 5 mg  5 mg Oral Q8H PRN Jomarie Longs, MD       Or  . haloperidol lactate (HALDOL) injection 5 mg  5 mg Intramuscular Q8H PRN Saramma Eappen, MD      . magnesium hydroxide (MILK OF MAGNESIA) suspension 30 mL  30 mL Oral Daily PRN Shuvon B Rankin, NP      . multivitamin with minerals tablet 1 tablet  1 tablet Oral Daily Sanjuana Kava, NP   1 tablet at 11/27/15 0810  . nicotine (NICODERM CQ - dosed in mg/24 hours) patch 21 mg  21 mg Transdermal Daily Oneta Rack, NP   21 mg at 11/24/15 0835  . sertraline (ZOLOFT) tablet 25 mg  25 mg Oral Daily Jomarie Longs, MD   25 mg at 11/27/15 0810  . thiamine (VITAMIN B-1) tablet 100 mg  100 mg Oral Daily Craige Cotta, MD   100 mg at 11/27/15 1610    Lab Results:  No results found for this or any previous visit (from the past 48 hour(s)).  Blood Alcohol level:  Lab Results  Component Value Date   ETH 167 (H) 11/18/2015   ETH 258 (H) 09/23/2015    Metabolic Disorder Labs: Lab Results  Component Value Date   HGBA1C 5.0 11/23/2015   MPG 97 11/23/2015   Lab Results  Component Value Date   PROLACTIN 1.8 (L) 11/24/2015   Lab Results  Component Value Date   CHOL 177 11/23/2015    TRIG 52 11/23/2015   HDL 93 11/23/2015   CHOLHDL 1.9 11/23/2015   VLDL 10 11/23/2015   LDLCALC 74 11/23/2015    Physical Findings: AIMS: Facial and Oral Movements Muscles of Facial Expression: None, normal Lips and Perioral Area: None, normal Jaw: None, normal Tongue: None, normal,Extremity Movements Upper (arms, wrists, hands, fingers): None, normal Lower (legs, knees, ankles, toes): None, normal, Trunk Movements Neck, shoulders, hips: None, normal,  Overall Severity Severity of abnormal movements (highest score from questions above): None, normal Incapacitation due to abnormal movements: None, normal Patient's awareness of abnormal movements (rate only patient's report): No Awareness, Dental Status Current problems with teeth and/or dentures?: No Does patient usually wear dentures?: No  CIWA:  CIWA-Ar Total: 0 COWS:  COWS Total Score: 3  Musculoskeletal: Strength & Muscle Tone: within normal limits Gait & Station: normal Patient leans: N/A  Psychiatric Specialty Exam: Physical Exam  Nursing note and vitals reviewed.   Review of Systems  Gastrointestinal: Positive for diarrhea.  Psychiatric/Behavioral: Positive for depression and substance abuse. The patient is nervous/anxious and has insomnia.   All other systems reviewed and are negative.   Blood pressure 97/74, pulse 99, temperature 97.4 F (36.3 C), temperature source Oral, resp. rate 12, height 5\' 8"  (1.727 m), weight 79.8 kg (176 lb), SpO2 100 %.Body mass index is 26.76 kg/m.  General Appearance: Disheveled  Eye Contact:  Fair  Speech:  Normal Rate  Volume:  Decreased  Mood:  Anxious  Affect:  Appropriate and Congruent  Thought Process:  Goal Directed and Descriptions of Associations: Circumstantial  Orientation:  Other:  person, place  Thought Content:  Delusions, Paranoid Ideation and Rumination improving  Suicidal Thoughts:  No  Homicidal Thoughts:  denies - but is a danger to self or others due to  paranoia  Memory:  Immediate;   Fair Recent;   Fair Remote;   Poor  Judgement:  Impaired  Insight:  Shallow  Psychomotor Activity:  Restlessness  Concentration:  Concentration: Fair and Attention Span: Fair  Recall:  [employer]Fair  Fund of Knowledge:  Fair  Language:  Fair  Akathisia:  No  Handed:  Right  AIMS (if indicated):     Assets:  Desire for Improvement  ADL's:  Intact  Cognition:  WNL  Sleep:  Number of Hours: 6.5     Treatment Plan Summary: Casimiro NeedleMichael is a [age] y old single , AAM who lives in [place] , [place], has a hx of alcohol use disorder and depression, [facility].  Daily contact with patient to assess and evaluate symptoms and progress in treatment and Medication management   Will continue  Abilify 10 mg po qhs for psychosis. Will continue Zoloft 25 mg po daily for affective sx. Continue Doxepin 10 mg po qhs for sleep. Will continue CIWA/Librium protocol for alcohol withdrawal sx. Will make available PRN medications as per agitation protocol. Reviewed past medical records,treatment plan.  Will continue to monitor vitals ,medication compliance and treatment side effects  Will monitor for medical issues as well as call consult as needed.  Reviewed ekg for qtc - WNL, TSH WNL, lipid panel-WNL, hba1c -wnl , prolactin- 1.8. CSW will continue working on disposition. Pt to be referred to substance abuse treatment program.He is motivated to do so. Patient to participate in therapeutic milieu .  Disposition plan is pending  Leata MouseJANARDHANA Kagan Hietpas, MD  11/27/2015, 3:28 PM

## 2015-11-27 NOTE — Plan of Care (Signed)
Problem: Activity: Goal: Interest or engagement in leisure activities will improve Outcome: Progressing Patient has been on the unit playing when permitted on the 300 hall. He plays the piano to help cope with his anxiety and plays the piano extremely well.

## 2015-11-27 NOTE — Progress Notes (Signed)
Patient has denied SI,HI and AVH this shift. Patient has been compliant with medications and reported being in a good mood. Patient plans to continue to treatment upon discharge and reports an increase in his mood. Patient has made effective use of his coping skills and has had no behavioral dyscontrol. Patient had an issue with peer this shift and needed redirection Patient was able to calm and returned to a bright and happy mood.   Assess patient for safety, offer medications as prescribed, continue to monitor for 1:1 staff talks.   Patient able to contract for safety

## 2015-11-27 NOTE — [facility] Group Notes (Signed)
[facility] Group Notes:  (Clinical Social Work)  11/27/2015  11:00AM-12:00PM  Summary of Progress/Problems:  The main focus of today's process group was to listen to a variety of genres of music and to identify that different types of music provoke different responses.  The patient then was able to identify personally what was soothing for them, as well as energizing, as well as how patient can personally use this knowledge in sleep habits, with depression, and with other symptoms.  The patient expressed at the beginning of group the overall feeling of "calm, cool and collected" and said he is coming to the conclusion that pursuing sobriety is something that would really benefit his vision of his future.  He interacted well and was dancing and happy throughout group.  At the end of group he instructed CSW about how to pull up a song he composed that is on YouTube.  Type of Therapy:  Music Therapy   Participation Level:  Active  Participation Quality:  Attentive and Sharing  Affect:  Appropriate  Cognitive:  Oriented  Insight:  Engaged  Engagement in Therapy:  Engaged  Modes of Intervention:   Activity, Exploration  Ambrose MantleMareida Grossman-Orr, LCSW 11/27/2015

## 2015-11-28 MED ORDER — NICOTINE 21 MG/24HR TD PT24: 21.0000 mg | MEDICATED_PATCH | Freq: Every day | TRANSDERMAL | 0 refills | Status: DC

## 2015-11-28 MED ORDER — ARIPIPRAZOLE 10 MG PO TABS: 10.0000 mg | ORAL_TABLET | Freq: Every day | ORAL | 0 refills | Status: DC

## 2015-11-28 MED ORDER — BENZTROPINE MESYLATE 0.5 MG PO TABS: 0.5000 mg | ORAL_TABLET | Freq: Every day | ORAL | 0 refills | Status: DC

## 2015-11-28 MED ORDER — DOXEPIN HCL 10 MG PO CAPS: 10.0000 mg | ORAL_CAPSULE | Freq: Every day | ORAL | 0 refills | Status: DC

## 2015-11-28 MED ORDER — ACAMPROSATE CALCIUM 333 MG PO TBEC: 666.0000 mg | DELAYED_RELEASE_TABLET | Freq: Three times a day (TID) | ORAL | 0 refills | Status: DC

## 2015-11-28 MED ORDER — SERTRALINE HCL 25 MG PO TABS: 25.0000 mg | ORAL_TABLET | Freq: Every day | ORAL | 0 refills | Status: DC

## 2015-11-28 NOTE — Plan of Care (Signed)
Problem: [facility] Participation in Recreation Therapeutic Interventions Goal: STG-Patient will demonstrate improved communication skills b STG: Communication - Patient will improve communication skills, as demonstrated by ability to actively participate in at least 2 processing discussion during recreation therapy group sessions by conclusion of recreation therapy tx  Outcome: Completed/Met Date Met: 11/28/15 Pt was able to improve communication skills by participating in the processing of various recreation therapy sessions such as anger management, self-esteem and leisure education.  Victorino Sparrow, LRT/CTRS

## 2015-11-28 NOTE — Progress Notes (Signed)
Patient d/ced with a bus pass, sts he will return Thursday for an outpatient appointment

## 2015-11-28 NOTE — Progress Notes (Signed)
Recreation Therapy Notes  Date: 11/28/15 Time: 1000 Location: 500 Hall Dayroom  Group Topic: Communication  Goal Area(s) Addresses:  Patient will effectively communicate with peers in group.  Patient will verbalize benefit of healthy communication. Patient will verbalize positive effect of healthy communication on post d/c goals.  Patient will identify communication techniques that made activity effective for group.   Behavioral Response: Engaged  Intervention:  Dry erase board, eraser, dry erase marker, strips of paper with random words   Activity: Pictionary.  LRT are divided the group into two teams.  Each person would get a turn.  Each person would draw a strip of paer from the container.  The person has to draw whatever is on the paper,  on the board.  While they are drawing, their team is trying to guess what it is they are drawing.  If the team guesses the picture they get a point.     Education:Communication, Discharge Planning  Education Outcome: Acknowledges understanding/In group clarification offered/Needs additional education.   Clinical Observations/Feedback: Pt was very active during group.  Pt was very engaged in with his peers.  Pt encouraged his peers in group.   Caroll RancherMarjette [name] Rogoff, LRT/CTRS        Lillia AbedLindsay, Nirali Magouirk A 11/28/2015 11:59 AM

## 2015-11-28 NOTE — [facility] Suicide Risk Assessment (Signed)
[facility]   Principal Problem: MDD (major depressive disorder), recurrent, severe, with psychosis (HCC) Discharge Diagnoses:  Patient Active Problem List   Diagnosis Date Noted  . MDD (major depressive disorder), recurrent, severe, with psychosis (HCC) [F33.3] 11/21/2015  . Cocaine use disorder, mild, abuse [F14.10] 11/21/2015  . Alcohol use disorder, severe, dependence (HCC) [F10.20] 11/20/2015    Class: Acute  . Alcohol intoxication (HCC) [F10.129]   . Syncope [R55] 09/24/2015  . Alcohol withdrawal (HCC) [F10.239] 09/23/2015  . Altered mental status [R41.82] 09/23/2015    Total Time spent with patient: 30 minutes  Musculoskeletal: Strength & Muscle Tone: within normal limits Gait & Station: normal Patient leans: N/A  Psychiatric Specialty Exam: Review of Systems  Psychiatric/Behavioral: Positive for substance abuse. Negative for depression, hallucinations and suicidal ideas.  All other systems reviewed and are negative.   Blood pressure 107/77, pulse (!) 101, temperature 98.2 F (36.8 C), temperature source Oral, resp. rate 16, height 5\' 8"  (1.727 m), weight 79.8 kg (176 lb), SpO2 100 %.Body mass index is 26.76 kg/m.  General Appearance: Casual  Eye Contact::  Fair  Speech:  Clear and Coherent409  Volume:  Normal  Mood:  Euthymic  Affect:  Appropriate  Thought Process:  Goal Directed and Descriptions of Associations: Intact  Orientation:  Full (Time, Place, and Person)  Thought Content:  Logical  Suicidal Thoughts:  No  Homicidal Thoughts:  No  Memory:  Immediate;   Fair Recent;   Fair Remote;   Fair  Judgement:  Fair  Insight:  Fair  Psychomotor Activity:  Normal  Concentration:  Fair  Recall:  [employer]iservFair  Fund of Knowledge:Fair  Language: Fair  Akathisia:  No  Handed:  Right  AIMS (if indicated):   0  Assets:  Desire for Improvement  Sleep:  Number of Hours: 5.75  Cognition: WNL  ADL's:  Intact   Mental Status Per Nursing  Assessment::   On Admission:  NA  Demographic Factors:  Male  Loss Factors: NA  Historical Factors: Impulsivity  Risk Reduction Factors:   Positive social support  Continued Clinical Symptoms:  Alcohol/Substance Abuse/Dependencies Previous Psychiatric Diagnoses and Treatments  Cognitive Features That Contribute To Risk:  None    Suicide Risk:  Minimal: No identifiable suicidal ideation.  Patients presenting with no risk factors but with morbid ruminations; may be classified as minimal risk based on the severity of the depressive symptoms    Plan Of Care/Follow-up recommendations:  Activity:  no restrictions Diet:  regular Tests:  as needed Other:  none  Nocole Zammit, MD 11/28/2015, 9:30 AM

## 2015-11-28 NOTE — Tx Team (Signed)
Interdisciplinary Treatment and Diagnostic Plan Update  11/28/2015 Time of Session: 11:25 AM  CORDAE MCCAREY MRN: [id number]  Principal Diagnosis: MDD (major depressive disorder), recurrent, severe, with psychosis ([place])  Secondary Diagnoses: Principal Problem:   MDD (major depressive disorder), recurrent, severe, with psychosis ([place]) Active Problems:   Alcohol use disorder, severe, dependence ([place])   Cocaine use disorder, mild, abuse   Current Medications:  Current Facility-Administered Medications  Medication Dose Route Frequency Provider Last Rate Last Dose  . acamprosate (CAMPRAL) tablet 666 mg  666 mg Oral TID WC Ursula Alert, MD   666 mg at 11/28/15 0639  . acetaminophen (TYLENOL) tablet 650 mg  650 mg Oral Q6H PRN Shuvon B Rankin, NP   650 mg at 11/23/15 2220  . alum & mag hydroxide-simeth (MAALOX/MYLANTA) 200-200-20 MG/5ML suspension 30 mL  30 mL Oral Q4H PRN Shuvon B Rankin, NP   30 mL at 11/24/15 2141  . ARIPiprazole (ABILIFY) tablet 10 mg  10 mg Oral Daily Ursula Alert, MD   10 mg at 11/28/15 0814  . benztropine (COGENTIN) tablet 0.5 mg  0.5 mg Oral Daily Ursula Alert, MD   0.5 mg at 11/28/15 0814  . bismuth subsalicylate (PEPTO BISMOL) chewable tablet 524 mg  2 tablet Oral Q4H PRN Encarnacion Slates, NP   524 mg at 11/25/15 2035  . chlordiazePOXIDE (LIBRIUM) capsule 25 mg  25 mg Oral QID PRN Ursula Alert, MD   25 mg at 11/24/15 2144  . diphenhydrAMINE (BENADRYL) injection 25 mg  25 mg Intramuscular Q8H PRN Ursula Alert, MD       Or  . diphenhydrAMINE (BENADRYL) capsule 25 mg  25 mg Oral Q8H PRN Ursula Alert, MD      . doxepin (SINEQUAN) capsule 10 mg  10 mg Oral QHS Ursula Alert, MD   10 mg at 11/27/15 2138  . haloperidol (HALDOL) tablet 5 mg  5 mg Oral Q8H PRN Ursula Alert, MD       Or  . haloperidol lactate (HALDOL) injection 5 mg  5 mg Intramuscular Q8H PRN Saramma Eappen, MD      . magnesium hydroxide (MILK OF MAGNESIA) suspension 30 mL  30 mL Oral Daily  PRN Shuvon B Rankin, NP      . multivitamin with minerals tablet 1 tablet  1 tablet Oral Daily Encarnacion Slates, NP   1 tablet at 11/28/15 0814  . nicotine (NICODERM CQ - dosed in mg/24 hours) patch 21 mg  21 mg Transdermal Daily Derrill Center, NP   21 mg at 11/24/15 0835  . sertraline (ZOLOFT) tablet 25 mg  25 mg Oral Daily Ursula Alert, MD   25 mg at 11/28/15 0814  . thiamine (VITAMIN B-1) tablet 100 mg  100 mg Oral Daily Jenne Campus, MD   100 mg at 11/28/15 3875    PTA Medications: Prescriptions Prior to Admission  Medication Sig Dispense Refill Last Dose  . LORazepam (ATIVAN) 1 MG tablet Take 18m 3 times a day for 1 day,Take 127mtwice a day for 1 day, take 1 mg daily for 1 day. then stop. (Patient not taking: Reported on 11/18/2015) 6 tablet 0 Unknown  . Multiple Vitamin (MULTIVITAMIN WITH MINERALS) TABS tablet Take 1 tablet by mouth daily. (Patient not taking: Reported on 11/18/2015) 30 tablet 0 Unknown  . thiamine 100 MG tablet Take 1 tablet (100 mg total) by mouth daily. (Patient not taking: Reported on 11/18/2015) 30 tablet 0 Unknown    Treatment Modalities: Medication Management,  Group therapy, Case management,  1 to 1 session with clinician, Psychoeducation, Recreational therapy.   Physician Treatment Plan for Primary Diagnosis: MDD (major depressive disorder), recurrent, severe, with psychosis ([place]) Long Term Goal(s): Improvement in symptoms so as ready for discharge  Short Term Goals: Compliance with prescribed medications will improve  Medication Management: Evaluate patient's response, side effects, and tolerance of medication regimen.  Therapeutic Interventions: 1 to 1 sessions, Unit Group sessions and Medication administration.  Evaluation of Outcomes: Met  Physician Treatment Plan for Secondary Diagnosis: Principal Problem:   MDD (major depressive disorder), recurrent, severe, with psychosis ([place]) Active Problems:   Alcohol use disorder, severe, dependence ([place])    Cocaine use disorder, mild, abuse   Long Term Goal(s): Improvement in symptoms so as ready for discharge  Short Term Goals: Ability to identify triggers associated with substance abuse/mental health issues will improve  Medication Management: Evaluate patient's response, side effects, and tolerance of medication regimen.  Therapeutic Interventions: 1 to 1 sessions, Unit Group sessions and Medication administration.  Evaluation of Outcomes: Met   RN Treatment Plan for Primary Diagnosis: MDD (major depressive disorder), recurrent, severe, with psychosis ([place]) Long Term Goal(s): Knowledge of disease and therapeutic regimen to maintain health will improve  Short Term Goals: Compliance with prescribed medications will improve  Medication Management: RN will administer medications as ordered by provider, will assess and evaluate patient's response and provide education to patient for prescribed medication. RN will report any adverse and/or side effects to prescribing provider.  Therapeutic Interventions: 1 on 1 counseling sessions, Psychoeducation, Medication administration, Evaluate responses to treatment, Monitor vital signs and CBGs as ordered, Perform/monitor CIWA, COWS, AIMS and Fall Risk screenings as ordered, Perform wound care treatments as ordered.  Evaluation of Outcomes: Met   LCSW Treatment Plan for Primary Diagnosis: MDD (major depressive disorder), recurrent, severe, with psychosis ([place]) Long Term Goal(s): Safe transition to appropriate next level of care at discharge, Engage patient in therapeutic group addressing interpersonal concerns.  Short Term Goals: Engage patient in aftercare planning with referrals and resources  Therapeutic Interventions: Assess for all discharge needs, 1 to 1 time with Social worker, Explore available resources and support systems, Assess for adequacy in community support network, Educate family and significant other(s) on suicide prevention,  Complete Psychosocial Assessment, Interpersonal group therapy.  Evaluation of Outcomes: Met See Below   Progress in Treatment: Attending groups: Yes Participating in groups: Yes Taking medication as prescribed: Yes Toleration medication: Yes, no side effects reported at this time Family/Significant other contact made: No Patient understands diagnosis: No  Limited insight Discussing patient identified problems/goals with staff: Yes Medical problems stabilized or resolved: Yes Denies suicidal/homicidal ideation: Yes Issues/concerns per patient self-inventory: None Other: N/A  New problem(s) identified: None identified at this time.   New Short Term/Long Term Goal(s): None identified at this time.   Discharge Plan or Barriers:  States he [place] streets [place] and find a job while following up at [employer] 9/11:  States he will rturn home to [place], follow up with [facility]  Reason for Continuation of Hospitalization   Medication stabilization   Estimated Length of Stay: D/C today  Attendees: Patient: 11/28/2015  11:25 AM  Physician: Ursula Alert, MD 11/28/2015  11:25 AM  Nursing: Hoy Register, RN 11/28/2015  11:25 AM  RN Care Manager: Lars Pinks, RN 11/28/2015  11:25 AM  Social Worker: Ripley Fraise 11/28/2015  11:25 AM  Recreational Therapist: Laretta Bolster  11/28/2015  11:25 AM  Other: Norberto Sorenson 11/28/2015  11:25 AM  Other:  11/28/2015  11:25 AM    Scribe for Treatment Team:  Roque Lias 11/28/2015 11:25 AM

## 2015-11-28 NOTE — [facility] Suicide Risk Assessment (Signed)
[facility] INPATIENT:  Family/Significant Other Suicide Prevention Education  Suicide Prevention Education:  Patient Refusal for Family/Significant Other Suicide Prevention Education: The patient Karn PicklerMichael E Fregia has refused to provide written consent for family/significant other to be provided Family/Significant Other Suicide Prevention Education during admission and/or prior to discharge.  Physician notified.  Baldo DaubRodney B [facility] 11/28/2015, 11:24 AM

## 2015-11-28 NOTE — Progress Notes (Signed)
  [facility]  Will you be returning to the same living situation after discharge:  Yes,  home At discharge, do you have transportation home?: Yes,  bus pass Do you have the ability to pay for your medications: Yes,  insurance  Release of information consent forms completed and in the chart;  Patient's signature needed at discharge.  Patient to Follow up at: Follow-up Information    [facility] Follow up on 12/01/2015.   Specialty:  Behavioral Health Why:  [facility] at 9:30 [facility].  If accepted here, you will also have access to a psychiatrist.  If this program does not work out, [place] [facility] [place]. SolicitorContact information: [address] [place] [id number] [phone]          Next level of care provider [facility] Link:yes  Safety Planning and Suicide Prevention discussed: Yes,  yes  Have you used any form of tobacco in the last 30 days? (Cigarettes, Smokeless Tobacco, Cigars, and/or Pipes): Yes  Has patient been referred to the Quitline?: Patient refused referral  Patient has been referred for addiction treatment: Yes  Robert Hurley 11/28/2015, 11:27 AM

## 2015-12-06 NOTE — Discharge Summary (Signed)
Physician Discharge Summary Note  Patient:  Robert Hurley is an 45 y.o., male MRN:  [id number] DOB:  1970/08/27 Patient phone:  [phone] (home)  Patient address:   [address] [place] [address],  Total Time spent with patient: 30 minutes  Date of Admission:  11/19/2015 Date of Discharge: 11/28/2015  Reason for Admission:  Alcohol intoxication  Principal Problem: MDD (major depressive disorder), recurrent, severe, with psychosis [facility]) Discharge Diagnoses: Patient Active Problem List   Diagnosis Date Noted  . MDD (major depressive disorder), recurrent, severe, with psychosis ([facility]) [F33.3] 11/21/2015  . Cocaine use disorder, mild, abuse [F14.10] 11/21/2015  . Alcohol use disorder, severe, dependence ([facility]) [F10.20] 11/20/2015    Class: Acute  . Alcohol intoxication ([facility]) [F10.129]   . Syncope [R55] 09/24/2015  . Alcohol withdrawal ([facility]) [F10.239] 09/23/2015  . Altered mental status [R41.82] 09/23/2015    Past Psychiatric History: see HPI  Past Medical History: History reviewed. No pertinent past medical history. History reviewed. No pertinent surgical history. Family History:  Family History  Problem Relation Age of Onset  . Alcoholism Maternal Aunt   . Alcoholism Maternal Uncle    Family Psychiatric  History: see HPI Social History:  History  Alcohol use Not on file    Comment: 14 gallons per week (2 gallons per day)     History  Drug Use    Social History   Social History  . Marital status: Single    Spouse name: N/A  . Number of children: N/A  . Years of education: N/A   Social History Main Topics  . Smoking status: Current Every Day Smoker    Packs/day: 1.00    Types: Cigarettes  . Smokeless tobacco: Never Used  . Alcohol use None     Comment: 14 gallons per week (2 gallons per day)  . Drug use:   . Sexual activity: Not Asked   Other Topics Concern  . None   Social History Narrative  . None    Hospital Course:  Robert Hurley, a 45 year old AA male with hx of chronic alcoholism. Ad[facility] to the [facility] adult unit with complaints of alcohol intoxication requiring detoxification treatments.   Robert Hurley was admitted for MDD (major depressive disorder), recurrent, severe, with psychosis ([facility]) and crisis management.  Patient was treated with medications with their indications listed below in detail under Medication List.  Medical problems were identified and treated as needed.  Home medications were restarted as appropriate.  Patient encouraged to attend groups to help with recognizing triggers of emotional crises and de-stabilizations.  Patient encouraged to attend group to help identify the positive things in life that would help in dealing with feelings of loss, depression and unhealthy or abusive tendencies.         Robert Hurley was evaluated by the treatment team for stability and plans for continued recovery upon discharge.  Patient was offered further treatment options upon discharge including Residential, Intensive Outpatient and Outpatient treatment. Patient will follow up with agency listed below for medication management and counseling.  Encouraged patient to maintain satisfactory support network and home environment.  Advised to adhere to medication compliance and outpatient treatment follow up.  Prescriptions provided.       Robert Hurley motivation was an integral factor for scheduling further treatment.  Employment, transportation, bed availability, health status, family support, and any pending legal issues were also considered during patient's hospital stay.  Upon completion of this admission  the patient was both mentally and medically stable for discharge denying suicidal/homicidal ideation, auditory/visual/tactile hallucinations, delusional thoughts and paranoia.      Physical Findings: AIMS: Facial and Oral Movements Muscles of Facial Expression: None, normal Lips and Perioral  Area: None, normal Jaw: None, normal Tongue: None, normal,Extremity Movements Upper (arms, wrists, hands, fingers): None, normal Lower (legs, knees, ankles, toes): None, normal, Trunk Movements Neck, shoulders, hips: None, normal, Overall Severity Severity of abnormal movements (highest score from questions above): None, normal Incapacitation due to abnormal movements: None, normal Patient's awareness of abnormal movements (rate only patient's report): No Awareness, Dental Status Current problems with teeth and/or dentures?: No Does patient usually wear dentures?: No  CIWA:  CIWA-Ar Total: 0 COWS:  COWS Total Score: 3  Musculoskeletal: Strength & Muscle Tone: within normal limits Gait & Station: normal Patient leans: N/A  Psychiatric Specialty Exam:  See MD SRA Physical Exam  ROS  Blood pressure 113/79, pulse 89, temperature 98.7 F (37.1 C), resp. rate 18, height 5\' 8"  (1.727 m), weight 79.8 kg (176 lb), SpO2 99 %.Body mass index is 26.76 kg/m.   Have you used any form of tobacco in the last 30 days? (Cigarettes, Smokeless Tobacco, Cigars, and/or Pipes): Yes  Has this patient used any form of tobacco in the last 30 days? (Cigarettes, Smokeless Tobacco, Cigars, and/or Pipes) Yes, N/A  Blood Alcohol level:  Lab Results  Component Value Date   ETH 167 (H) 11/18/2015   ETH 258 (H) 09/23/2015    Metabolic Disorder Labs:  Lab Results  Component Value Date   HGBA1C 5.0 11/23/2015   MPG 97 11/23/2015   Lab Results  Component Value Date   PROLACTIN 1.8 (L) 11/24/2015   Lab Results  Component Value Date   CHOL 177 11/23/2015   TRIG 52 11/23/2015   HDL 93 11/23/2015   CHOLHDL 1.9 11/23/2015   VLDL 10 11/23/2015   LDLCALC 74 11/23/2015    See Psychiatric Specialty Exam and Suicide Risk Assessment completed by Attending Physician prior to discharge.  Discharge destination:  Home  Is patient on multiple antipsychotic therapies at discharge:  No   Has Patient had three  or more failed trials of antipsychotic monotherapy by history:  No  Recommended Plan for Multiple Antipsychotic Therapies: NA     Medication List    STOP taking these medications   LORazepam 1 MG tablet Commonly known as:  ATIVAN   multivitamin with minerals Tabs tablet   thiamine 100 MG tablet     TAKE these medications     Indication  acamprosate 333 MG tablet Commonly known as:  CAMPRAL Take 2 tablets (666 mg total) by mouth 3 (three) times daily with meals.  Indication:  Excessive Use of Alcohol   ARIPiprazole 10 MG tablet Commonly known as:  ABILIFY Take 1 tablet (10 mg total) by mouth daily.  Indication:  mood stabilization   benztropine 0.5 MG tablet Commonly known as:  COGENTIN Take 1 tablet (0.5 mg total) by mouth daily.  Indication:  Parkinson's Disease with Unknown Cause   doxepin 10 MG capsule Commonly known as:  SINEQUAN Take 1 capsule (10 mg total) by mouth at bedtime.  Indication:  Major Depressive Disorder   nicotine 21 mg/24hr patch Commonly known as:  NICODERM CQ - dosed in mg/24 hours Place 1 patch (21 mg total) onto the skin daily.  Indication:  Nicotine Addiction   sertraline 25 MG tablet Commonly known as:  ZOLOFT Take 1 tablet (25 mg total)  by mouth daily.  Indication:  Major Depressive Disorder      Follow-up Information    [facility] Follow up on 12/01/2015.   Specialty:  Behavioral Health Why:  Thursday at 9:30 with [facility].  If accepted here, you will also have access to a psychiatrist.  If this program does not work out, [place] [facility] [place]. SolicitorContact information: [address] [place] [phone]          Follow-up recommendations:  Activity:  as tol Diet:  as tol  Comments:  1.  Take all your medications as prescribed.   2.  Report any adverse side effects to outpatient provider. 3.  Patient instructed to not use alcohol  or illegal drugs while on prescription medicines. 4.  In the event of worsening symptoms, instructed patient to call 911, the crisis hotline or go to nearest emergency room for evaluation of symptoms.  Signed: Lindwood QuaSheila May Chika Cichowski, NP [facility] 12/06/2015, 3:00 PM

## 2017-06-29 ENCOUNTER — Ambulatory Visit (HOSPITAL_COMMUNITY): Payer: Medicare PPO | Admitting: Psychiatry

## 2018-06-16 ENCOUNTER — Emergency Department (HOSPITAL_COMMUNITY)
Admission: EM | Admit: 2018-06-16 | Discharge: 2018-06-16 | Disposition: A | Discharge status: Home / Self Care | Payer: Medicare Other | Attending: Emergency Medicine | Admitting: Emergency Medicine

## 2018-06-16 ENCOUNTER — Encounter (HOSPITAL_COMMUNITY): Payer: Self-pay | Admitting: *Deleted

## 2018-06-16 ENCOUNTER — Other Ambulatory Visit: Payer: Self-pay

## 2018-06-16 DIAGNOSIS — F333 Major depressive disorder, recurrent, severe with psychotic symptoms: Secondary | ICD-10-CM | POA: Diagnosis not present

## 2018-06-16 DIAGNOSIS — Z79899 Other long term (current) drug therapy: Secondary | ICD-10-CM | POA: Insufficient documentation

## 2018-06-16 DIAGNOSIS — F17210 Nicotine dependence, cigarettes, uncomplicated: Secondary | ICD-10-CM | POA: Diagnosis not present

## 2018-06-16 DIAGNOSIS — F10129 Alcohol abuse with intoxication, unspecified: Secondary | ICD-10-CM | POA: Diagnosis present

## 2018-06-16 DIAGNOSIS — F1010 Alcohol abuse, uncomplicated: Secondary | ICD-10-CM

## 2018-06-16 HISTORY — DX: Schizoaffective disorder, unspecified: F25.9

## 2018-06-16 LAB — CBC WITH DIFFERENTIAL/PLATELET
ABS IMMATURE GRANULOCYTES: 0.01 10*3/uL (ref 0.00–0.07)
Basophils Absolute: 0 10*3/uL (ref 0.0–0.1)
Basophils Relative: 1 %
EOS PCT: 0 %
Eosinophils Absolute: 0 10*3/uL (ref 0.0–0.5)
HEMATOCRIT: 43.2 % (ref 39.0–52.0)
HEMOGLOBIN: 13.7 g/dL (ref 13.0–17.0)
Immature Granulocytes: 0 %
LYMPHS PCT: 49 %
Lymphs Abs: 1.9 10*3/uL (ref 0.7–4.0)
MCH: 29.5 pg (ref 26.0–34.0)
MCHC: 31.7 g/dL (ref 30.0–36.0)
MCV: 93.1 fL (ref 80.0–100.0)
MONO ABS: 0.4 10*3/uL (ref 0.1–1.0)
Monocytes Relative: 11 %
NEUTROS ABS: 1.6 10*3/uL — AB (ref 1.7–7.7)
Neutrophils Relative %: 39 %
Platelets: 237 10*3/uL (ref 150–400)
RBC: 4.64 MIL/uL (ref 4.22–5.81)
RDW: 15.3 % (ref 11.5–15.5)
WBC: 3.9 10*3/uL — ABNORMAL LOW (ref 4.0–10.5)
nRBC: 0 % (ref 0.0–0.2)

## 2018-06-16 LAB — COMPREHENSIVE METABOLIC PANEL
ALBUMIN: 3.8 g/dL (ref 3.5–5.0)
ALT: 83 U/L — AB (ref 0–44)
ANION GAP: 10 (ref 5–15)
AST: 99 U/L — ABNORMAL HIGH (ref 15–41)
Alkaline Phosphatase: 52 U/L (ref 38–126)
BILIRUBIN TOTAL: 0.4 mg/dL (ref 0.3–1.2)
BUN: 9 mg/dL (ref 6–20)
CALCIUM: 8.3 mg/dL — AB (ref 8.9–10.3)
CO2: 20 mmol/L — AB (ref 22–32)
Chloride: 109 mmol/L (ref 98–111)
Creatinine, Ser: 0.67 mg/dL (ref 0.61–1.24)
GFR calc Af Amer: >60 mL/min (ref >60)
GFR calc non Af Amer: >60 mL/min (ref >60)
GLUCOSE: 86 mg/dL (ref 70–99)
Potassium: 3.6 mmol/L (ref 3.5–5.1)
SODIUM: 139 mmol/L (ref 135–145)
TOTAL PROTEIN: 7.2 g/dL (ref 6.5–8.1)

## 2018-06-16 LAB — ETHANOL: Alcohol, Ethyl (B): 167 mg/dL — ABNORMAL HIGH (ref <10)

## 2018-06-16 LAB — VALPROIC ACID LEVEL: Valproic Acid Lvl: <10 ug/mL — ABNORMAL LOW (ref 50.0–100.0)

## 2018-06-16 MED ORDER — CHLORDIAZEPOXIDE HCL 25 MG PO CAPS: ORAL_CAPSULE | ORAL | 0 refills | Status: DC

## 2018-06-16 NOTE — ED Provider Notes (Signed)
[facility]-EMERGENCY DEPT Provider Note   CSN: [id number] Arrival date & time: 06/16/18  0026    History   Chief Complaint Chief Complaint  Patient presents with  . Alcohol Intoxication  . Detox    HPI Robert Hurley is a 48 y.o. male.     Patient with history of alcohol abuse, cocaine abuse, schizoaffective disorder presenting with alcohol intoxication requesting detox.  States he drinks "3 L" of alcohol a day.  Last drink was just prior to arrival.  States he drinks wine, vodka as well as beer.  States he has tried to quit in the past and been clean for about 3 years.  Does have shakiness with withdrawal but has never had a seizure.  He denies any other drug use.  States he no longer uses cocaine.  States he is not taking Depakote any longer.  States he is taking Abilify, Cogentin, and Zoloft for his schizoaffective disorder.  He denies any suicidal or homicidal thoughts.  States he hears voices at times and believes that he is hanging out in a "violent area".  States he is not homeless and has a safe place to go. He denies any suicidal or homicidal thoughts at this time.  No chest pain, shortness of breath, abdominal pain, nausea, vomiting.  The history is provided by the patient.  Alcohol Intoxication  Pertinent negatives include no chest pain, no abdominal pain and no shortness of breath.    Past Medical History:  Diagnosis Date  . Schizoaffective disorder [facility])     Patient Active Problem List   Diagnosis Date Noted  . MDD (major depressive disorder), recurrent, severe, with psychosis (HCC) 11/21/2015  . Cocaine use disorder, mild, abuse (HCC) 11/21/2015  . Alcohol use disorder, severe, dependence (HCC) 11/20/2015    Class: Acute  . Alcohol intoxication (HCC)   . Syncope 09/24/2015  . Alcohol withdrawal (HCC) 09/23/2015  . Altered mental status 09/23/2015    History reviewed. No pertinent surgical history.      Home Medications     Prior to Admission medications   Medication Sig Start Date End Date Taking? Authorizing Provider  acamprosate (CAMPRAL) 333 MG tablet Take 2 tablets (666 mg total) by mouth 3 (three) times daily with meals. 11/28/15   Adonis Brook, NP  ARIPiprazole (ABILIFY) 10 MG tablet Take 1 tablet (10 mg total) by mouth daily. 11/28/15   Adonis Brook, NP  benztropine (COGENTIN) 0.5 MG tablet Take 1 tablet (0.5 mg total) by mouth daily. 11/28/15   Adonis Brook, NP  doxepin (SINEQUAN) 10 MG capsule Take 1 capsule (10 mg total) by mouth at bedtime. 11/28/15   Adonis Brook, NP  nicotine (NICODERM CQ - DOSED IN MG/24 HOURS) 21 mg/24hr patch Place 1 patch (21 mg total) onto the skin daily. 11/28/15   Adonis Brook, NP  sertraline (ZOLOFT) 25 MG tablet Take 1 tablet (25 mg total) by mouth daily. 11/28/15   Adonis Brook, NP    Family History Family History  Problem Relation Age of Onset  . Alcoholism Maternal Aunt   . Alcoholism Maternal Uncle     Social History Social History   Tobacco Use  . Smoking status: Current Every Day Smoker    Packs/day: 1.00    Types: Cigarettes  . Smokeless tobacco: Never Used  Substance Use Topics  . Alcohol use: Yes    Comment: 14 gallons per week (2 gallons per day)  . Drug use: Not Currently     Allergies  Lamictal [lamotrigine] and Seroquel [quetiapine fumarate]   Review of Systems Review of Systems  Constitutional: Negative for activity change, appetite change and fever.  HENT: Negative for congestion and rhinorrhea.   Respiratory: Negative for cough, chest tightness and shortness of breath.   Cardiovascular: Negative for chest pain.  Gastrointestinal: Negative for abdominal pain and vomiting.  Genitourinary: Negative for dysuria and hematuria.  Musculoskeletal: Negative for arthralgias and myalgias.  Psychiatric/Behavioral: Positive for confusion, dysphoric mood and hallucinations. Negative for behavioral problems, sleep disturbance and  suicidal ideas. The patient is nervous/anxious.    all other systems are negative except as noted in the HPI and PMH.     Physical Exam Updated Vital Signs BP 116/84 (BP Location: Left Arm)   Pulse 98   Temp 97.7 F (36.5 C) (Oral)   Resp 18   Ht 5\' 8"  (1.727 m)   Wt 79.4 kg   SpO2 92%   BMI 26.61 kg/m   Physical Exam Vitals signs and nursing note reviewed.  Constitutional:      General: He is not in acute distress.    Appearance: He is well-developed.     Comments: Intoxicated.  Alert and oriented x3.  HENT:     Head: Normocephalic and atraumatic.     Mouth/Throat:     Pharynx: No oropharyngeal exudate.  Eyes:     Conjunctiva/sclera: Conjunctivae normal.     Pupils: Pupils are equal, round, and reactive to light.  Neck:     Musculoskeletal: Normal range of motion and neck supple.     Comments: No meningismus. Cardiovascular:     Rate and Rhythm: Normal rate and regular rhythm.     Heart sounds: Normal heart sounds. No murmur.  Pulmonary:     Effort: Pulmonary effort is normal. No respiratory distress.     Breath sounds: Normal breath sounds.  Chest:     Chest wall: No tenderness.  Abdominal:     Palpations: Abdomen is soft.     Tenderness: There is no abdominal tenderness. There is no guarding or rebound.  Musculoskeletal: Normal range of motion.        General: No tenderness.  Skin:    General: Skin is warm.     Capillary Refill: Capillary refill takes less than 2 seconds.  Neurological:     General: No focal deficit present.     Mental Status: He is alert and oriented to person, place, and time. Mental status is at baseline.     Cranial Nerves: No cranial nerve deficit.     Motor: No abnormal muscle tone.     Coordination: Coordination normal.     Comments: No ataxia on finger to nose bilaterally. No pronator drift. 5/5 strength throughout. CN 2-12 intact.Equal grip strength. Sensation intact.   Psychiatric:        Behavior: Behavior normal.      ED  Treatments / Results  Labs (all labs ordered are listed, but only abnormal results are displayed) Labs Reviewed  CBC WITH DIFFERENTIAL/PLATELET - Abnormal; Notable for the following components:      Result Value   WBC 3.9 (*)    Neutro Abs 1.6 (*)    All other components within normal limits  COMPREHENSIVE METABOLIC PANEL - Abnormal; Notable for the following components:   CO2 20 (*)    Calcium 8.3 (*)    AST 99 (*)    ALT 83 (*)    All other components within normal limits  ETHANOL - Abnormal; Notable  for the following components:   Alcohol, Ethyl (B) 167 (*)    All other components within normal limits  VALPROIC ACID LEVEL - Abnormal; Notable for the following components:   Valproic Acid Lvl <10 (*)    All other components within normal limits  RAPID URINE DRUG SCREEN, HOSP PERFORMED    EKG None  Radiology No results found.  Procedures Procedures (including critical care time)  Medications Ordered in ED Medications - No data to display   Initial Impression / Assessment and Plan / ED Course  I have reviewed the triage vital signs and the nursing notes.  Pertinent labs & imaging results that were available during my care of the patient were reviewed by me and considered in my medical decision making (see chart for details).       Patient with alcohol intoxication as well as hearing voices but no suicidal homicidal thoughts.  He is alert and oriented.  Vitals are stable.  There is no evidence of life-threatening alcohol withdrawal.  Labs show alcohol intoxication with slight elevation of transaminases.  Patient has been seen by TTS and does not meet inpatient criteria.  He denies hearing any voices or suicidal thoughts.  Patient is stable for discharge.  He will be given Librium taper and instructed not to drink alcohol with medication.  He is given a list of outpatient resources.  Return precautions discussed.  No evidence of life-threatening alcohol withdrawal.   Final Clinical Impressions(s) / ED Diagnoses   Final diagnoses:  Alcohol abuse    ED Discharge Orders    None       Miamarie Moll, Jeannett Senior, MD 06/16/18 [phone]

## 2018-06-16 NOTE — Discharge Instructions (Addendum)
Follow-up with a facility for detox from the attached list.  Take the medication as prescribed do not drink alcohol while taking this medication.  If you are going to keep drinking, do not take this medication.  Return to the ED with new or worsening symptoms.

## 2018-06-16 NOTE — ED Notes (Signed)
TTS machine at bedside. Patient given urinal to provide urine sample.
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
## 2018-06-16 NOTE — Progress Notes (Signed)
Per Nira Conn, NP pt is psych cleared and does not meet criteria for inpt treatment. Pt is recommended for peer support consult. EDP Rancour, Jeannett Senior, MD and pt's nurse Ladona Ridgel, RN have been advised.   Princess Bruins, MSW, LCSW Therapeutic Triage Specialist  [phone]

## 2018-06-16 NOTE — ED Triage Notes (Signed)
Pt requesting detox from ETOH.  Pt stated "I drank right before I came in.  I usually drink 3 liters a day."

## 2018-10-21 ENCOUNTER — Emergency Department (HOSPITAL_COMMUNITY)
Admission: EM | Admit: 2018-10-21 | Discharge: 2018-10-21 | Disposition: A | Discharge status: Home / Self Care | Payer: Medicare Other | Attending: Emergency Medicine | Admitting: Emergency Medicine

## 2018-10-21 ENCOUNTER — Encounter (HOSPITAL_COMMUNITY): Payer: Self-pay

## 2018-10-21 ENCOUNTER — Emergency Department (HOSPITAL_COMMUNITY): Payer: Medicare Other

## 2018-10-21 DIAGNOSIS — R4182 Altered mental status, unspecified: Secondary | ICD-10-CM | POA: Diagnosis present

## 2018-10-21 DIAGNOSIS — F10920 Alcohol use, unspecified with intoxication, uncomplicated: Secondary | ICD-10-CM | POA: Diagnosis not present

## 2018-10-21 DIAGNOSIS — Y908 Blood alcohol level of 240 mg/100 ml or more: Secondary | ICD-10-CM | POA: Insufficient documentation

## 2018-10-21 DIAGNOSIS — Z79899 Other long term (current) drug therapy: Secondary | ICD-10-CM | POA: Diagnosis not present

## 2018-10-21 LAB — CBC WITH DIFFERENTIAL/PLATELET
Abs Immature Granulocytes: 0.01 10*3/uL (ref 0.00–0.07)
Basophils Absolute: 0 10*3/uL (ref 0.0–0.1)
Basophils Relative: 1 %
Eosinophils Absolute: 0 10*3/uL (ref 0.0–0.5)
Eosinophils Relative: 0 %
HCT: 39.2 % (ref 39.0–52.0)
Hemoglobin: 12.9 g/dL — ABNORMAL LOW (ref 13.0–17.0)
Immature Granulocytes: 0 %
Lymphocytes Relative: 46 %
Lymphs Abs: 2.6 10*3/uL (ref 0.7–4.0)
MCH: 31 pg (ref 26.0–34.0)
MCHC: 32.9 g/dL (ref 30.0–36.0)
MCV: 94.2 fL (ref 80.0–100.0)
Monocytes Absolute: 0.6 10*3/uL (ref 0.1–1.0)
Monocytes Relative: 10 %
Neutro Abs: 2.4 10*3/uL (ref 1.7–7.7)
Neutrophils Relative %: 43 %
Platelets: 207 10*3/uL (ref 150–400)
RBC: 4.16 MIL/uL — ABNORMAL LOW (ref 4.22–5.81)
RDW: 14.8 % (ref 11.5–15.5)
WBC: 5.6 10*3/uL (ref 4.0–10.5)
nRBC: 0 % (ref 0.0–0.2)

## 2018-10-21 LAB — RAPID URINE DRUG SCREEN, HOSP PERFORMED
Amphetamines: NOT DETECTED
Barbiturates: NOT DETECTED
Benzodiazepines: NOT DETECTED
Cocaine: NOT DETECTED
Opiates: NOT DETECTED
Tetrahydrocannabinol: POSITIVE — AB

## 2018-10-21 LAB — CBG MONITORING, ED
Glucose-Capillary: 53 mg/dL — ABNORMAL LOW (ref 70–99)
Glucose-Capillary: 59 mg/dL — ABNORMAL LOW (ref 70–99)
Glucose-Capillary: 103 mg/dL — ABNORMAL HIGH (ref 70–99)
Glucose-Capillary: 96 mg/dL (ref 70–99)
Glucose-Capillary: 95 mg/dL (ref 70–99)
Glucose-Capillary: 80 mg/dL (ref 70–99)
Glucose-Capillary: 80 mg/dL (ref 70–99)
Glucose-Capillary: 80 mg/dL (ref 70–99)

## 2018-10-21 LAB — COMPREHENSIVE METABOLIC PANEL
ALT: 75 U/L — ABNORMAL HIGH (ref 0–44)
AST: 110 U/L — ABNORMAL HIGH (ref 15–41)
Albumin: 3.6 g/dL (ref 3.5–5.0)
Alkaline Phosphatase: 44 U/L (ref 38–126)
Anion gap: 14 (ref 5–15)
BUN: 9 mg/dL (ref 6–20)
CO2: 21 mmol/L — ABNORMAL LOW (ref 22–32)
Calcium: 8.2 mg/dL — ABNORMAL LOW (ref 8.9–10.3)
Chloride: 106 mmol/L (ref 98–111)
Creatinine, Ser: 0.67 mg/dL (ref 0.61–1.24)
GFR calc Af Amer: >60 mL/min (ref >60)
GFR calc non Af Amer: >60 mL/min (ref >60)
Glucose, Bld: 63 mg/dL — ABNORMAL LOW (ref 70–99)
Potassium: 3.2 mmol/L — ABNORMAL LOW (ref 3.5–5.1)
Sodium: 141 mmol/L (ref 135–145)
Total Bilirubin: 0.5 mg/dL (ref 0.3–1.2)
Total Protein: 6.7 g/dL (ref 6.5–8.1)

## 2018-10-21 LAB — SALICYLATE LEVEL: Salicylate Lvl: <7 mg/dL (ref 2.8–30.0)

## 2018-10-21 LAB — ACETAMINOPHEN LEVEL: Acetaminophen (Tylenol), Serum: <10 ug/mL — ABNORMAL LOW (ref 10–30)

## 2018-10-21 LAB — ETHANOL: Alcohol, Ethyl (B): 291 mg/dL — ABNORMAL HIGH (ref <10)

## 2018-10-21 MED ORDER — THIAMINE HCL 100 MG/ML IJ SOLN
100.0000 mg | Freq: Once | INTRAMUSCULAR | Status: AC
  Administered 2018-10-21: 100 mg via INTRAVENOUS
  Filled 2018-10-21: qty 2

## 2018-10-21 MED ORDER — DEXTROSE 10 % IV SOLN
INTRAVENOUS | Status: DC
  Administered 2018-10-21: 14:00:00 via INTRAVENOUS
  Filled 2018-10-21: qty 1000

## 2018-10-21 NOTE — ED Notes (Signed)
SW came to see the patient and he was gone

## 2018-10-21 NOTE — ED Notes (Signed)
Consult to social work@19 :20pm.

## 2018-10-21 NOTE — ED Notes (Signed)
Patient given meal tray.

## 2018-10-21 NOTE — ED Notes (Signed)
Robert Householder, MD made aware of CBG-80.   Patient given another meal tray and OJ.  Patient refusing to eat right now.

## 2018-10-21 NOTE — ED Notes (Signed)
Patient mumbles to sternal rub.   Called pharmacy for D10.

## 2018-10-21 NOTE — Progress Notes (Signed)
CSW met with the pt and asked where the pt planned to go after discharge.  P stated he didn't know, but then asked where the CSW could, "find me a place?".  CSW stated CSW only had shelter options and pt shook his head with a grin and stated, "I don't get along with those places".  CSW further asked if pt had been to either [facility] in [place], or to the [facility] in [place] and pt stated, "Those places don't like me much".  CSW asked if pt had an identification and pt stated, "Yes".  Pt then began to speak about how he was "reay to go anyway" and stated, "I can just begin on my little journey".  Pt then stated some thoughts that were somewhat disconnected, but spoke in such a low-pitched voice CSW had to continually ask the pt to speak up.    It was at this point that the pt stated, "I'm not worried about it", and "I can find a place to go"  When the CSW asked pt about considering the shelters again the pt stated he was not interested.  CSW stated he would provide pt with a shelter list nonetheless in case pt changed his mind and pt was appreciative and thanked the [place].  CSW will continue to follow for D/C needs.  Alphonse Guild. Charnell Peplinski, LCSW, LCAS, CSI Transitions of Care Clinical Social Worker Care Coordination Department Ph: [phone]

## 2018-10-21 NOTE — ED Notes (Signed)
Provided patient 8oz Cranberry Juice and a [place] Sandwich.

## 2018-10-21 NOTE — ED Notes (Signed)
Unable to assess neuro status. Patient has incomprehensible speech at this time.

## 2018-10-21 NOTE — ED Provider Notes (Signed)
[facility]-EMERGENCY DEPT Provider Note   CSN: [id number] Arrival date & time: 10/21/18  1304     History   Chief Complaint Chief Complaint  Patient presents with  . Drug Overdose    HPI Robert Hurley is a 48 y.o. male.     Patient is a 48 year old male with a history of alcohol abuse, schizoaffective disorder who was found today lying [place] to the bus stop somnolent.  History was provided by police and EMS.  Bystander called police when they noticed he was laying on the ground and not responding.  EMS gave patient Narcan with minimal response but patient became hypoxic and was placed on a nonrebreather.  Upon arrival here patient is responsive to painful stimuli and will intermittently be able to say his name.  There was no witnessed falls or trauma.  No blood at the scene.  Patient at the scene had all of his belongings with him and there is concerned that he may be homeless.  The history is provided by the EMS personnel. The history is limited by the condition of the patient.  Drug Overdose    Past Medical History:  Diagnosis Date  . Schizoaffective disorder [facility](HCC)     Patient Active Problem List   Diagnosis Date Noted  . MDD (major depressive disorder), recurrent, severe, with psychosis (HCC) 11/21/2015  . Cocaine use disorder, mild, abuse (HCC) 11/21/2015  . Alcohol use disorder, severe, dependence (HCC) 11/20/2015    Class: Acute  . Alcohol intoxication (HCC)   . Syncope 09/24/2015  . Alcohol withdrawal (HCC) 09/23/2015  . Altered mental status 09/23/2015    History reviewed. No pertinent surgical history.      Home Medications    Prior to Admission medications   Medication Sig Start Date End Date Taking? Authorizing Provider  acamprosate (CAMPRAL) 333 MG tablet Take 2 tablets (666 mg total) by mouth 3 (three) times daily with meals. 11/28/15   Adonis BrookAgustin, Sheila, NP  ARIPiprazole (ABILIFY) 10 MG tablet Take 1 tablet (10 mg total) by  mouth daily. 11/28/15   Adonis BrookAgustin, Sheila, NP  benztropine (COGENTIN) 0.5 MG tablet Take 1 tablet (0.5 mg total) by mouth daily. 11/28/15   Adonis BrookAgustin, Sheila, NP  chlordiazePOXIDE (LIBRIUM) 25 MG capsule 50mg  PO TID x 1D, then 25-50mg  PO BID X 1D, then 25-50mg  PO QD X 1D 06/16/18   Rancour, Jeannett SeniorStephen, MD  doxepin (SINEQUAN) 10 MG capsule Take 1 capsule (10 mg total) by mouth at bedtime. Patient not taking: Reported on 06/16/2018 11/28/15   Adonis BrookAgustin, Sheila, NP  Multiple Vitamins-Minerals (ZINC PO) Take 1 tablet by mouth daily.    [provider]  nicotine (NICODERM CQ - DOSED IN MG/24 HOURS) 21 mg/24hr patch Place 1 patch (21 mg total) onto the skin daily. Patient not taking: Reported on 06/16/2018 11/28/15   Adonis BrookAgustin, Sheila, NP  sertraline (ZOLOFT) 25 MG tablet Take 1 tablet (25 mg total) by mouth daily. Patient not taking: Reported on 06/16/2018 11/28/15   Adonis BrookAgustin, Sheila, NP  tolnaftate (ANTIFUNGAL) 1 % cream Apply 1 application topically 2 (two) times daily as needed. Fungus    [provider]    Family History Family History  Problem Relation Age of Onset  . Alcoholism Maternal Aunt   . Alcoholism Maternal Uncle     Social History Social History   Tobacco Use  . Smoking status: Current Every Day Smoker    Packs/day: 1.00    Types: Cigarettes  . Smokeless tobacco: Never Used  Substance Use Topics  . Alcohol use: Yes    Comment: 14 gallons per week (2 gallons per day)  . Drug use: Not Currently     Allergies   Lamictal [lamotrigine] and Seroquel [quetiapine fumarate]   Review of Systems Review of Systems  Unable to perform ROS: Mental status change     Physical Exam Updated Vital Signs BP 110/69   Pulse 92   Temp 97.9 F (36.6 C) (Rectal)   Resp 20   SpO2 100%   Physical Exam Vitals signs and nursing note reviewed.  Constitutional:      General: He is not in acute distress.    Appearance: He is well-developed and normal weight.  HENT:     Head:  Normocephalic and atraumatic.     Nose: Nose normal.  Eyes:     Conjunctiva/sclera: Conjunctivae normal.     Comments: Pinpoint pupils bilaterally  Neck:     Musculoskeletal: Normal range of motion and neck supple.  Cardiovascular:     Rate and Rhythm: Normal rate and regular rhythm.     Heart sounds: No murmur.  Pulmonary:     Effort: Pulmonary effort is normal. No respiratory distress.     Breath sounds: Normal breath sounds. No wheezing or rales.  Abdominal:     General: There is no distension.     Palpations: Abdomen is soft.     Tenderness: There is no abdominal tenderness. There is no guarding or rebound.  Musculoskeletal: Normal range of motion.        General: No tenderness.  Skin:    General: Skin is warm and dry.     Findings: No erythema or rash.  Neurological:     Mental Status: He is lethargic.     Comments: Lethargic but will arouse to painful stimuli      ED Treatments / Results  Labs (all labs ordered are listed, but only abnormal results are displayed) Labs Reviewed  COMPREHENSIVE METABOLIC PANEL - Abnormal; Notable for the following components:      Result Value   Potassium 3.2 (*)    CO2 21 (*)    Glucose, Bld 63 (*)    Calcium 8.2 (*)    AST 110 (*)    ALT 75 (*)    All other components within normal limits  ACETAMINOPHEN LEVEL - Abnormal; Notable for the following components:   Acetaminophen (Tylenol), Serum <10 (*)    All other components within normal limits  ETHANOL - Abnormal; Notable for the following components:   Alcohol, Ethyl (B) 291 (*)    All other components within normal limits  RAPID URINE DRUG SCREEN, HOSP PERFORMED - Abnormal; Notable for the following components:   Tetrahydrocannabinol POSITIVE (*)    All other components within normal limits  CBC WITH DIFFERENTIAL/PLATELET - Abnormal; Notable for the following components:   RBC 4.16 (*)    Hemoglobin 12.9 (*)    All other components within normal limits  CBG MONITORING, ED  - Abnormal; Notable for the following components:   Glucose-Capillary 59 (*)    All other components within normal limits  CBG MONITORING, ED - Abnormal; Notable for the following components:   Glucose-Capillary 53 (*)    All other components within normal limits  CBG MONITORING, ED - Abnormal; Notable for the following components:   Glucose-Capillary 103 (*)    All other components within normal limits  SALICYLATE LEVEL  CBG MONITORING, ED  CBG MONITORING, ED  CBG MONITORING, ED  CBG MONITORING, ED  CBG MONITORING, ED  CBG MONITORING, ED    EKG EKG Interpretation  Date/Time:  Tuesday October 21 2018 13:10:40 EDT Ventricular Rate:  89 PR Interval:    QRS Duration: 95 QT Interval:  387 QTC Calculation: 471 R Axis:   77 Text Interpretation:  Sinus rhythm Normal ECG Confirmed by Blanchie Dessert [phone]) on 10/21/2018 1:38:30 PM   Radiology Ct Head Wo Contrast  Result Date: 10/21/2018 CLINICAL DATA:  Altered level of consciousness. EXAM: CT HEAD WITHOUT CONTRAST CT CERVICAL SPINE WITHOUT CONTRAST TECHNIQUE: Multidetector CT imaging of the head and cervical spine was performed following the standard protocol without intravenous contrast. Multiplanar CT image reconstructions of the cervical spine were also generated. COMPARISON:  December 04, 2015 FINDINGS: CT HEAD FINDINGS Brain: No evidence of acute infarction, hemorrhage, hydrocephalus, extra-axial collection or mass lesion/mass effect. Vascular: No hyperdense vessel. Mild calcific atherosclerotic disease of the intra cavernous carotid arteries. Skull: Normal. Negative for fracture or focal lesion. Sinuses/Orbits: No acute finding. Other: None. CT CERVICAL SPINE FINDINGS Alignment: Normal. Skull base and vertebrae: No acute fracture. No primary bone lesion or focal pathologic process. Soft tissues and spinal canal: No prevertebral fluid or swelling. No visible canal hematoma. Disc levels: Mild osteoarthritic changes of the mid and lower  cervical spine. Upper chest: Negative. Other: None. IMPRESSION: 1. No acute intracranial abnormality. 2. No evidence of acute traumatic injury to cervical spine. 3. Mild osteoarthritic changes of the mid and lower cervical spine. Electronically Signed   By: Fidela Salisbury M.D.   On: 10/21/2018 15:01   Ct Cervical Spine Wo Contrast  Result Date: 10/21/2018 CLINICAL DATA:  Altered level of consciousness. EXAM: CT HEAD WITHOUT CONTRAST CT CERVICAL SPINE WITHOUT CONTRAST TECHNIQUE: Multidetector CT imaging of the head and cervical spine was performed following the standard protocol without intravenous contrast. Multiplanar CT image reconstructions of the cervical spine were also generated. COMPARISON:  December 04, 2015 FINDINGS: CT HEAD FINDINGS Brain: No evidence of acute infarction, hemorrhage, hydrocephalus, extra-axial collection or mass lesion/mass effect. Vascular: No hyperdense vessel. Mild calcific atherosclerotic disease of the intra cavernous carotid arteries. Skull: Normal. Negative for fracture or focal lesion. Sinuses/Orbits: No acute finding. Other: None. CT CERVICAL SPINE FINDINGS Alignment: Normal. Skull base and vertebrae: No acute fracture. No primary bone lesion or focal pathologic process. Soft tissues and spinal canal: No prevertebral fluid or swelling. No visible canal hematoma. Disc levels: Mild osteoarthritic changes of the mid and lower cervical spine. Upper chest: Negative. Other: None. IMPRESSION: 1. No acute intracranial abnormality. 2. No evidence of acute traumatic injury to cervical spine. 3. Mild osteoarthritic changes of the mid and lower cervical spine. Electronically Signed   By: Fidela Salisbury M.D.   On: 10/21/2018 15:01    Procedures Procedures (including critical care time)  Medications Ordered in ED Medications  dextrose 10 % infusion ( Intravenous New Bag/Given 10/21/18 1344)  thiamine (B-1) injection 100 mg (100 mg Intravenous Given 10/21/18 1345)      Initial Impression / Assessment and Plan / ED Course  I have reviewed the triage vital signs and the nursing notes.  Pertinent labs & imaging results that were available during my care of the patient were reviewed by me and considered in my medical decision making (see chart for details).        Patient is a 48 year old male who is presenting today for altered mental status.  Patient was found laying outside the bus stop.  Patient is responsive to painful  stimuli but is not awake.  Patient's pupils are pinpoint he had minimal response to Narcan.  He does have a history of significant alcohol abuse and concern this may be related to intoxication today.  He has no track marks on his arm or significant signs for heroin abuse.  He has no prior history of the same.  He has no evidence of trauma.  Upon arrival here blood pressure and oxygen saturation are within normal limits.  Patient was hypoglycemic with a sugar of 52.  He was given thiamine and D10.  We will continue to monitor.  Imaging and labs are pending.  CRITICAL CARE Performed by: Jalexia Lalli Total critical care time: 30 minutes Critical care time was exclusive of separately billable procedures and treating other patients. Critical care was necessary to treat or prevent imminent or life-threatening deterioration. Critical care was time spent personally by me on the following activities: development of treatment plan with patient and/or surrogate as well as nursing, discussions with consultants, evaluation of patient's response to treatment, examination of patient, obtaining history from patient or surrogate, ordering and performing treatments and interventions, ordering and review of laboratory studies, ordering and review of radiographic studies, pulse oximetry and re-evaluation of patient's condition.   Final Clinical Impressions(s) / ED Diagnoses   Final diagnoses:  None    ED Discharge Orders    None       Gwyneth SproutPlunkett,  Pachia Strum, MD 10/22/18 2312

## 2018-10-21 NOTE — Discharge Instructions (Signed)
Stop drinking alcohol. See resources   See your doctor   Stay hydrated   Return to ER if you have seizure, withdrawal, thoughts of harming yourself or others

## 2018-10-21 NOTE — ED Triage Notes (Signed)
Patient arrived via [facility] from bus [place] on [address].    Found patient laying on ground snoring.   [employer] dministered 2mg  of narcan intransal and became more responsive but still lethargic during transport.   Wakes up with sternal rub.    EKG normal per ems   CBG-81  Diaphoretic with ems.   No complaints of pain.    02 sats dropped to 79% and placed on non-rebreather at 97%.   20  left forearm.   Smells of ETOH  No obvious trauma noted by ems  c-collar placed by ems

## 2018-10-21 NOTE — ED Notes (Signed)
Patient given juice and sandwich per Darl Householder, MD.

## 2018-10-21 NOTE — ED Provider Notes (Signed)
  Physical Exam  BP 107/74 (BP Location: Left Arm)   Pulse 81   Temp 97.9 F (36.6 C) (Rectal)   Resp 19   SpO2 100%   Physical Exam  ED Course/Procedures     Procedures  MDM  Patient care assumed at 3 PM.  Patient intoxicated and altered. Signout pending alcohol level and CT head.  Patient was also hypoglycemic and was started on D10 drip and also need to reassess his sugars.  5 pm CBG stable around 80. Will take off glucose drip and feed patient. Patient more awake and alert now. ETOH was 300.   8:45 PM Social work states that patient has a place to go. Clinically sober now. Able to eat and maintain his CBG around 80. Stable for discharge.       Drenda Freeze, MD 10/21/18 2045

## 2020-06-25 ENCOUNTER — Encounter (HOSPITAL_COMMUNITY): Payer: Self-pay

## 2020-06-25 ENCOUNTER — Other Ambulatory Visit: Payer: Self-pay

## 2020-06-25 ENCOUNTER — Emergency Department (HOSPITAL_COMMUNITY)
Admission: EM | Admit: 2020-06-25 | Discharge: 2020-06-25 | Disposition: A | Discharge status: Home / Self Care | Payer: Medicaid Other | Attending: Emergency Medicine | Admitting: Emergency Medicine

## 2020-06-25 ENCOUNTER — Emergency Department (HOSPITAL_COMMUNITY): Payer: Medicaid Other

## 2020-06-25 DIAGNOSIS — J0110 Acute frontal sinusitis, unspecified: Secondary | ICD-10-CM | POA: Diagnosis not present

## 2020-06-25 DIAGNOSIS — R0981 Nasal congestion: Secondary | ICD-10-CM | POA: Diagnosis present

## 2020-06-25 DIAGNOSIS — F17210 Nicotine dependence, cigarettes, uncomplicated: Secondary | ICD-10-CM | POA: Diagnosis not present

## 2020-06-25 DIAGNOSIS — Z20822 Contact with and (suspected) exposure to covid-19: Secondary | ICD-10-CM | POA: Insufficient documentation

## 2020-06-25 LAB — BASIC METABOLIC PANEL
Anion gap: 9 (ref 5–15)
BUN: 11 mg/dL (ref 6–20)
CO2: 23 mmol/L (ref 22–32)
Calcium: 8.6 mg/dL — ABNORMAL LOW (ref 8.9–10.3)
Chloride: 104 mmol/L (ref 98–111)
Creatinine, Ser: 0.91 mg/dL (ref 0.61–1.24)
GFR, Estimated: >60 mL/min (ref >60)
Glucose, Bld: 109 mg/dL — ABNORMAL HIGH (ref 70–99)
Potassium: 4.1 mmol/L (ref 3.5–5.1)
Sodium: 136 mmol/L (ref 135–145)

## 2020-06-25 LAB — CBC WITH DIFFERENTIAL/PLATELET
Abs Immature Granulocytes: 0.01 10*3/uL (ref 0.00–0.07)
Basophils Absolute: 0 10*3/uL (ref 0.0–0.1)
Basophils Relative: 0 %
Eosinophils Absolute: 0 10*3/uL (ref 0.0–0.5)
Eosinophils Relative: 0 %
HCT: 39.2 % (ref 39.0–52.0)
Hemoglobin: 13.1 g/dL (ref 13.0–17.0)
Immature Granulocytes: 0 %
Lymphocytes Relative: 29 %
Lymphs Abs: 1.5 10*3/uL (ref 0.7–4.0)
MCH: 30.1 pg (ref 26.0–34.0)
MCHC: 33.4 g/dL (ref 30.0–36.0)
MCV: 90.1 fL (ref 80.0–100.0)
Monocytes Absolute: 0.5 10*3/uL (ref 0.1–1.0)
Monocytes Relative: 10 %
Neutro Abs: 3.1 10*3/uL (ref 1.7–7.7)
Neutrophils Relative %: 61 %
Platelets: 320 10*3/uL (ref 150–400)
RBC: 4.35 MIL/uL (ref 4.22–5.81)
RDW: 14.9 % (ref 11.5–15.5)
WBC: 5.1 10*3/uL (ref 4.0–10.5)
nRBC: 0 % (ref 0.0–0.2)

## 2020-06-25 LAB — RESP PANEL BY RT-PCR (FLU A&B, COVID) ARPGX2
Influenza A by PCR: NEGATIVE
Influenza B by PCR: NEGATIVE
SARS Coronavirus 2 by RT PCR: NEGATIVE

## 2020-06-25 MED ORDER — AMOXICILLIN-POT CLAVULANATE 875-125 MG PO TABS: 1.0000 | ORAL_TABLET | Freq: Two times a day (BID) | ORAL | 0 refills | Status: DC

## 2020-06-25 MED ORDER — BENZONATATE 100 MG PO CAPS: 200.0000 mg | ORAL_CAPSULE | Freq: Three times a day (TID) | ORAL | 0 refills | Status: DC | PRN

## 2020-06-25 NOTE — ED Provider Notes (Signed)
MSE was initiated and I personally evaluated the patient and placed orders (if any) at  10:50 AM on June 25, 2020.  The patient appears stable so that the remainder of the MSE may be completed by another provider.  HPI:  Robert Hurley is a 50 y.o. male presents to the emergency department with complaints of nasal congestion, frontal headache, mild sore throat, productive cough with "lime green" sputum, shortness of breath for the last 3 weeks.  Uses cigarettes.  Has received 3 Covid vaccines.  No sick contacts.  ROS:  Positive: Headache, nasal congestion, sore throat, productive cough, sputum, shortness of breath Negative: Chest pain, nausea, vomiting, abdominal pain, diarrhea  PE:  BP (!) 148/94 (BP Location: Left Arm)   Pulse (!) 105   Temp 98.5 F (36.9 C) (Oral)   Resp 18   SpO2 98%   General: Awake, no distress  HEENT: nasal congestion, clear rhinorrhea, oropharynx is erythematous Resp: Normal effort. Lungs mostly clear, deep breathing causes wet sounding cough Cardiac: normal rate Abd: Nondistended, nontender  MSK: moves all extremities without difficulty Neuro: speech clear   MDM: Ddx includes viral URI, bronchitis, bronchospasm, pneumonia.  MSE completed and orders placed as needed.    Patient made aware this encounter is a triage encounter and no beds are available at this time in the ER.  Patient made aware this is not a complete ER visit and patient will be placed in the waiting room while his work up is initiated. Patient made aware patient needs to await a formal ER encounter once a bed becomes available.  Patient made aware that exiting the department prior to completion of the work-up is considered leaving against medical advice and there is no guarantee that there are no emergency medical conditions present and patient assumes risk of leaving without completion of ER evaluation including worsening condition, permanent disability and death. Patient states  understanding.      Liberty Handy, PA-C 06/25/20 1052    Benjiman Core, MD 06/25/20 [phone]

## 2020-06-25 NOTE — Discharge Instructions (Addendum)
You were seen in the ER for nasal congestion, runny nose, headaches, cough  Labs, x-ray and COVID/flu were negative and normal  We will treat you with antibiotics for a suspected sinus infection and/or bronchitis  Take augmentin morning and night. Take tessalon perles 200 mg for cough.   Use 1 spray of fluticasone (flonase) in each nostril morning and night.  Take sudafed (or pseudophedrine) for nasal congestion.

## 2020-06-25 NOTE — ED Provider Notes (Signed)
[facility]-EMERGENCY DEPT Provider Note   CSN: [id number] Arrival date & time: 06/25/20  1030     History Chief Complaint  Patient presents with  . Nasal Congestion  . Headache    Robert Hurley is a 50 y.o. male presents to the ED for evaluation of nasal congestion, runny nose, facial pain and frontal headache, cough for the last 3 weeks.  Patient reports the mucus that he blows from his nose and that he coughs up is "limegreen".  Feels short of breath because he cannot breathe out of his nose.  He smokes cigarettes.  Has received 3 Covid vaccines, last one within the last 3 months.  No sick contacts.  Denies chest pain, nausea, vomiting, abdominal pain, diarrhea.  No interventions.  No modifying factors.  HPI     Past Medical History:  Diagnosis Date  . Schizoaffective disorder [facility])     Patient Active Problem List   Diagnosis Date Noted  . MDD (major depressive disorder), recurrent, severe, with psychosis (HCC) 11/21/2015  . Cocaine use disorder, mild, abuse (HCC) 11/21/2015  . Alcohol use disorder, severe, dependence (HCC) 11/20/2015    Class: Acute  . Alcohol intoxication (HCC)   . Syncope 09/24/2015  . Alcohol withdrawal (HCC) 09/23/2015  . Altered mental status 09/23/2015    History reviewed. No pertinent surgical history.     Family History  Problem Relation Age of Onset  . Alcoholism Maternal Aunt   . Alcoholism Maternal Uncle     Social History   Tobacco Use  . Smoking status: Current Every Day Smoker    Packs/day: 1.00    Types: Cigarettes  . Smokeless tobacco: Never Used  Substance Use Topics  . Alcohol use: Yes    Comment: 14 gallons per week (2 gallons per day)  . Drug use: Not Currently    Home Medications Prior to Admission medications   Medication Sig Start Date End Date Taking? Authorizing Provider  amoxicillin-clavulanate (AUGMENTIN) 875-125 MG tablet Take 1 tablet by mouth every 12 (twelve) hours. 06/25/20  Yes  Sharen Heck J, PA-C  benzonatate (TESSALON PERLES) 100 MG capsule Take 2 capsules (200 mg total) by mouth 3 (three) times daily as needed for cough. FOR DISRUPTIVE DAY TIME COUGH 06/25/20  Yes Liberty Handy, PA-C  acamprosate (CAMPRAL) 333 MG tablet Take 2 tablets (666 mg total) by mouth 3 (three) times daily with meals. 11/28/15   Adonis Brook, NP  ARIPiprazole (ABILIFY) 10 MG tablet Take 1 tablet (10 mg total) by mouth daily. 11/28/15   Adonis Brook, NP  benztropine (COGENTIN) 0.5 MG tablet Take 1 tablet (0.5 mg total) by mouth daily. 11/28/15   Adonis Brook, NP  chlordiazePOXIDE (LIBRIUM) 25 MG capsule 50mg  PO TID x 1D, then 25-50mg  PO BID X 1D, then 25-50mg  PO QD X 1D 06/16/18   Rancour, 06/18/18, MD  doxepin (SINEQUAN) 10 MG capsule Take 1 capsule (10 mg total) by mouth at bedtime. Patient not taking: Reported on 06/16/2018 11/28/15   01/28/16, NP  Multiple Vitamins-Minerals (ZINC PO) Take 1 tablet by mouth daily.    [provider]  nicotine (NICODERM CQ - DOSED IN MG/24 HOURS) 21 mg/24hr patch Place 1 patch (21 mg total) onto the skin daily. Patient not taking: Reported on 06/16/2018 11/28/15   01/28/16, NP  sertraline (ZOLOFT) 25 MG tablet Take 1 tablet (25 mg total) by mouth daily. Patient not taking: Reported on 06/16/2018 11/28/15   01/28/16, NP  tolnaftate (  ANTIFUNGAL) 1 % cream Apply 1 application topically 2 (two) times daily as needed. Fungus    [provider]    Allergies    Lamictal [lamotrigine] and Seroquel [quetiapine fumarate]  Review of Systems   Review of Systems  HENT: Positive for congestion, postnasal drip, rhinorrhea, sinus pressure and sinus pain.   Respiratory: Positive for cough.   Neurological: Positive for headaches.  All other systems reviewed and are negative.   Physical Exam Updated Vital Signs BP (!) 148/94 (BP Location: Left Arm)   Pulse (!) 105   Temp 98.5 F (36.9 C) (Oral)   Resp 18   SpO2 98%    Physical Exam Vitals and nursing note reviewed.  Constitutional:      General: He is not in acute distress.    Appearance: He is well-developed.     Comments: NAD.  HENT:     Head: Normocephalic and atraumatic.     Right Ear: External ear normal.     Left Ear: External ear normal.     Nose: Mucosal edema, congestion and rhinorrhea present. Rhinorrhea is clear.     Right Sinus: Frontal sinus tenderness present.     Left Sinus: Frontal sinus tenderness present.     Mouth/Throat:     Pharynx: Posterior oropharyngeal erythema present.  Eyes:     General: No scleral icterus.    Conjunctiva/sclera: Conjunctivae normal.  Cardiovascular:     Rate and Rhythm: Normal rate and regular rhythm.     Heart sounds: Normal heart sounds. No murmur heard.   Pulmonary:     Effort: Pulmonary effort is normal.     Breath sounds: Normal breath sounds. No wheezing.     Comments: Breathing even and unlabored. No wheezing. Slightly diminished air sounds lower lobes.  Musculoskeletal:        General: No deformity. Normal range of motion.     Cervical back: Normal range of motion and neck supple.  Skin:    General: Skin is warm and dry.     Capillary Refill: Capillary refill takes less than 2 seconds.  Neurological:     Mental Status: He is alert and oriented to person, place, and time.  Psychiatric:        Behavior: Behavior normal.        Thought Content: Thought content normal.        Judgment: Judgment normal.     ED Results / Procedures / Treatments   Labs (all labs ordered are listed, but only abnormal results are displayed) Labs Reviewed  BASIC METABOLIC PANEL - Abnormal; Notable for the following components:      Result Value   Glucose, Bld 109 (*)    Calcium 8.6 (*)    All other components within normal limits  RESP PANEL BY RT-PCR (FLU A&B, COVID) ARPGX2  CBC WITH DIFFERENTIAL/PLATELET    EKG None  Radiology DG Chest 1 View  Result Date: 06/25/2020 CLINICAL DATA:  Three  week history productive cough, shortness of breath, sinonasal congestion and headache. EXAM: PORTABLE CHEST 1 VIEW COMPARISON:  05/27/2018 and earlier. FINDINGS: Cardiac silhouette and mediastinal contours normal in appearance for the AP portable technique. Pulmonary parenchyma clear. Bronchovascular markings normal. Pulmonary vascularity normal. No pneumothorax. No visible pleural effusions. IMPRESSION: No acute cardiopulmonary disease. Electronically Signed   By: Hulan Saas M.D.   On: 06/25/2020 11:36    Procedures Procedures   Medications Ordered in ED Medications - No data to display  ED Course  I  have reviewed the triage vital signs and the nursing notes.  Pertinent labs & imaging results that were available during my care of the patient were reviewed by me and considered in my medical decision making (see chart for details).    MDM Rules/Calculators/A&P                          50 year old male presents for symptoms most consistent with sinusitis for the last 3 weeks.  Also has associated productive cough.  History of tobacco use.  No red flags like fevers, chest pain.  Does report shortness of breath but he attributes it to not being able to breathe through his nose.  Fully vaccinated with a booster.  No hemoptysis.  Labs and imaging ordered by me as above.  These were personally visualized and interpreted.  Labs reveal-unremarkable.  Normal WBC.  Negative respiratory panel.  Imaging reveals-chest x-ray without acute findings.  EKG unremarkable.  DDx-sinusitis/bronchitis.  No wheezing on exam to suggest COPD exacerbation.  We will discharge with Augmentin, Flonase, Tessalon Perles and Sudafed.  Return precautions discussed.  Patient is comfortable with this plan. Final Clinical Impression(s) / ED Diagnoses Final diagnoses:  Acute non-recurrent frontal sinusitis    Rx / DC Orders ED Discharge Orders         Ordered    amoxicillin-clavulanate (AUGMENTIN) 875-125 MG  tablet  Every 12 hours        06/25/20 1236    benzonatate (TESSALON PERLES) 100 MG capsule  3 times daily PRN        06/25/20 1238           Liberty Handy, PA-C 06/25/20 1247    Benjiman Core, MD 06/25/20 1512

## 2020-06-25 NOTE — ED Triage Notes (Signed)
Pt presents with c/o nasal congestion and headache for 3 weeks. Pt reports hx of migraines.

## 2020-08-11 ENCOUNTER — Emergency Department (HOSPITAL_COMMUNITY)
Admission: EM | Admit: 2020-08-11 | Discharge: 2020-08-11 | Disposition: A | Discharge status: Home / Self Care | Payer: Medicaid Other | Attending: Emergency Medicine | Admitting: Emergency Medicine

## 2020-08-11 ENCOUNTER — Encounter (HOSPITAL_COMMUNITY): Payer: Self-pay | Admitting: *Deleted

## 2020-08-11 ENCOUNTER — Other Ambulatory Visit: Payer: Self-pay

## 2020-08-11 DIAGNOSIS — F10120 Alcohol abuse with intoxication, uncomplicated: Secondary | ICD-10-CM | POA: Insufficient documentation

## 2020-08-11 DIAGNOSIS — Z5321 Procedure and treatment not carried out due to patient leaving prior to being seen by health care provider: Secondary | ICD-10-CM | POA: Insufficient documentation
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
## 2020-08-11 NOTE — ED Provider Notes (Signed)
Emergency Medicine Provider Triage Evaluation Note  Robert Hurley , a 50 y.o. male  was evaluated in triage.  Pt comes in with EMS. He was at a restaurant pta and was intoxicated and being belligerent. [employer] was called to the scene and EMS was later called due to "heavy breathing". Pt has no complaints on my eval other than his foot bunions. He states he has had "80 liters" of wine/beer today. He denies any injuries or other complaints.  Review of Systems  Positive: Intoxicated, bunions Negative: Sob, chest pain  Physical Exam  BP 119/87   Pulse 99   Temp 98.5 F (36.9 C) (Oral)   Resp 20   SpO2 98%  Gen:   Awake, no distress   Resp:  Normal effort  MSK:   Moves extremities without difficulty  Other:  Appears intoxicated but is alert and answering questions  Medical Decision Making  Medically screening exam initiated at 12:09 PM.  Appropriate orders placed.  ILIJA Hurley was informed that the remainder of the evaluation will be completed by another provider, this initial triage assessment does not replace that evaluation, and the importance of remaining in the ED until their evaluation is complete.    [name] 08/11/20 1211    Gerhard Munch, MD 08/11/20 1536

## 2020-08-11 NOTE — ED Notes (Signed)
Pt walked out of triage room prior to waiver being signed

## 2020-08-11 NOTE — ED Notes (Signed)
Pt walked of triage.

## 2020-09-30 ENCOUNTER — Emergency Department (HOSPITAL_COMMUNITY)
Admission: EM | Admit: 2020-09-30 | Discharge: 2020-09-30 | Discharge status: Against Medical Advice | Payer: Medicaid Other

## 2020-09-30 ENCOUNTER — Ambulatory Visit (HOSPITAL_COMMUNITY)
Admission: EM | Admit: 2020-09-30 | Discharge: 2020-09-30 | Disposition: A | Discharge status: Other Acute Inpatient Hospital | Payer: Medicaid Other | Attending: Licensed Clinical Social Worker | Admitting: Licensed Clinical Social Worker

## 2020-09-30 ENCOUNTER — Other Ambulatory Visit: Payer: Self-pay

## 2020-09-30 DIAGNOSIS — R251 Tremor, unspecified: Secondary | ICD-10-CM | POA: Insufficient documentation

## 2020-09-30 DIAGNOSIS — Z9151 Personal history of suicidal behavior: Secondary | ICD-10-CM | POA: Diagnosis not present

## 2020-09-30 DIAGNOSIS — F10129 Alcohol abuse with intoxication, unspecified: Secondary | ICD-10-CM | POA: Diagnosis not present

## 2020-09-30 DIAGNOSIS — Z8659 Personal history of other mental and behavioral disorders: Secondary | ICD-10-CM | POA: Insufficient documentation

## 2020-09-30 DIAGNOSIS — Z9114 Patient's other noncompliance with medication regimen: Secondary | ICD-10-CM | POA: Insufficient documentation

## 2020-09-30 DIAGNOSIS — R5383 Other fatigue: Secondary | ICD-10-CM | POA: Insufficient documentation

## 2020-09-30 DIAGNOSIS — R109 Unspecified abdominal pain: Secondary | ICD-10-CM | POA: Insufficient documentation

## 2020-09-30 DIAGNOSIS — Z5900 Homelessness unspecified: Secondary | ICD-10-CM | POA: Diagnosis not present

## 2020-09-30 NOTE — ED Notes (Signed)
Pt transported to [facility] via safe transport for medical clearance due to being intoxicated.

## 2020-09-30 NOTE — BH Assessment (Signed)
Triage Note- Urgent- Pt states police officer asked him if he needed help. Pt is unsure why officer suggested he come for assessment. Pt reports daily use of alcohol and cocaine 1-2x weekly. He reports w/d sx of anxiety and tremulous hands. He denies SI, HI and AVH. He reports he had a suicide attempt "as a kid". Officer states pt has paranoid schizophrenia and saw pt engaging in conversation yesterday with a person that was not there. Pt inpt at [facility] for alcohol w/d and MDD with psychosis in 2017.

## 2020-09-30 NOTE — BH Assessment (Addendum)
Comprehensive Clinical Assessment (CCA) Note  09/30/2020 Robert Hurley [id number]  Chief Complaint:  Chief Complaint  Patient presents with   Urgent Emergent Eval   Visit Diagnosis:  MDD, recurrent, severe without sx of psychosis; Alcohol Dependence; Stimulant abuse   Disposition: Pt sent to [facility] for medical clearance by Dr. Leone Haven.  Robert Hurley is a single 50 yo male who presents to [facility] at suggestion of his [employer] fast streamer. Officer was concerned for pt's recent apparent delusional behavior. Pt denies AVH. He denies current SI and HI. He reports multiple sx of Depression, but denies hopelessness. 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 He is agreeable to substance abuse tx.   CCA Screening, Triage and Referral (STR)  Patient Reported Information How did you hear about [place]? Other (Comment) (police)  What Is the Reason for Your Visit/Call Today? Pt states police officer asked him if he needed help. Pt is unsure why officer suggested he come for assessment. Pt reports daily use of alcohol and cocaine 1-2x weekly.  How Long Has This Been Causing You Problems? > than 6 months  What Do You Feel Would Help You the Most Today? Alcohol or Drug Use Treatment; Treatment for Depression or other mood problem (Pt slow to answer if he wants substance abuse tx. Stated he would like to cut down on alcohol use)   Have You Recently Had Any Thoughts About Hurting Yourself? No  Are You Planning to Commit Suicide/Harm Yourself At This time? No   Have you Recently Had Thoughts About Hurting Someone Robert Hurley? No  Are You Planning to Harm Someone at This Time? No  Explanation: No data recorded  Have You Used Any Alcohol or Drugs in the Past 24 Hours? Yes  How Long Ago Did You Use Drugs or Alcohol? No data recorded What Did You Use and How Much? -stopped counting after 5 beers   Do You Currently Have a Therapist/Psychiatrist? No  Name of Therapist/Psychiatrist: No data  recorded  Have You Been Recently Discharged From Any Office Practice or Programs? No     CCA Screening Triage Referral Assessment Type of Contact: Face-to-Face  Initial Assessment via telepsych  Location of Assessment: [facility] Assessment Services  Provider Location: [facility] Assessment Services   Is CPS involved or ever been involved? Never  Is APS involved or ever been involved? Never   Patient Determined To Be At Risk for Harm To Self or Others Based on Review of Patient Reported Information or Presenting Complaint? Yes, for Self-Harm   Does Patient Present under Involuntary Commitment? No   [place] of Residence: [place]   Patient Currently Receiving the Following Services: Not Receiving Services   Determination of Need: Urgent (48 hours)   Options For Referral: Medication Management; BH Urgent Care   CCA Biopsychosocial Patient Reported Schizophrenia/Schizoaffective Diagnosis in Past: No   Strengths: Still gets enjoyment out of somethings. 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   Mental Health Symptoms Depression:   Change in energy/activity; Fatigue; Increase/decrease in appetite; Irritability; Sleep (too much or little); Weight gain/loss   Duration of Depressive symptoms:  Duration of Depressive Symptoms: Greater than two weeks   Mania:   N/A   Anxiety:    Difficulty concentrating; Fatigue; Irritability; Restlessness; Tension; Sleep; Worrying   Psychosis:   None   Duration of Psychotic symptoms:    Trauma:   N/A   Obsessions:   N/A   Compulsions:   N/A  Inattention:   N/A   Hyperactivity/Impulsivity:   N/A   Oppositional/Defiant Behaviors:   N/A   Emotional Irregularity:   N/A   Other Mood/Personality Symptoms:  No data recorded   Mental Status Exam Appearance and self-care  Stature:   Average   Weight:   Average weight   Clothing:   Disheveled; Casual   Grooming:   Neglected    Cosmetic use:   None   Posture/gait:   Tense; Stooped   Motor activity:   Not Remarkable   Sensorium  Attention:   Normal; Distractible   Concentration:   Anxiety interferes   Orientation:   X5   Recall/memory:   Normal   Affect and Mood  Affect:   Anxious; Constricted   Mood:   Anxious; Depressed   Relating  Eye contact:   Avoided   Facial expression:   Constricted; Tense   Attitude toward examiner:   Cooperative   Thought and Language  Speech flow:  Clear and Coherent; Soft; Paucity   Thought content:   Appropriate to Mood and Circumstances   Preoccupation:   None   Hallucinations:   None   Organization:  No data recorded  [employer] of Knowledge:   Good   Intelligence:   Average   Abstraction:   Normal   Judgement:   Fair; Impaired   Reality Testing:   Variable   Insight:   Gaps   Decision Making:   Vacilates   Social Functioning  Social Maturity:   Isolates   Social Judgement:   "Street Smart"   Stress  Stressors:   Housing; Illness; Financial; Relationship   Coping Ability:   Deficient supports   Skill Deficits:   Interpersonal   Supports:   Support needed     Leisure/Recreation: Leisure / Recreation Do You Have Hobbies?: Yes Leisure and Hobbies: plays saxaphone  Exercise/Diet: Exercise/Diet Have You Gained or Lost A Significant Amount of Weight in the Past Six Months?: Yes-Lost Number of Pounds Lost?: 20 (in past 4 months) Do You Have Any Trouble Sleeping?: Yes   CCA Employment/Education Employment/Work Situation: Employment / Work Situation Employment Situation: On disability Has Patient ever Been in Equities trader?: Yes (Describe in comment)  Education: Education Is Patient Currently Attending School?: No   CCA Family/Childhood History Family and Relationship History: Family history Does patient have children?: Yes How many children?: 2 How is patient's relationship with  their children?: States he has a 66 and 55 Year old that live in a condo he set them up with in [place].  Childhood History:  Childhood History Did patient suffer any verbal/emotional/physical/sexual abuse as a child?: No Did patient suffer from severe childhood neglect?: No Has patient ever been sexually abused/assaulted/raped as an adolescent or adult?: No   CCA Substance Use Alcohol/Drug Use: Alcohol / Drug Use Pain Medications: See MAR Prescriptions: See MAR Over the Counter: See MAR History of alcohol / drug use?: Yes Longest period of sobriety (when/how long): 2 years Negative Consequences of Use: Financial, Personal relationships, Armed forces operational officer, Work / School Withdrawal Symptoms: Tremors, Other (Comment) Substance #1 Name of Substance 1: alcohol 1 - Amount (size/oz): "stopped counting at 5 beers" 1 - Frequency: daily 1 - Duration: years 1 - Last Use / Amount: last night Substance #2 Name of Substance 2: cocaine 2 - Frequency: daily 2 - Duration: years 2 - Route of Substance Use: smoke and snort      ASAM's:  Six Dimensions of Multidimensional Assessment  Dimension  1:  Acute Intoxication and/or Withdrawal Potential:   Dimension 1:  Description of individual's past and current experiences of substance use and withdrawal: pt presents with hand tremors and anxiety from alcohol withdrawl  Dimension 2:  Biomedical Conditions and Complications:   Dimension 2:  Description of patient's biomedical conditions and  complications: Uncomfortable with increased anxiety and tremors  Dimension 3:  Emotional, Behavioral, or Cognitive Conditions and Complications:  Dimension 3:  Description of emotional, behavioral, or cognitive conditions and complications: hx of schizoaffective d/o  Dimension 4:  Readiness to Change:  Dimension 4:  Description of Readiness to Change criteria: Civil [employer] fast streamer initiated pt getting help today  Dimension 5:  Relapse, Continued use, or Continued Problem Potential:   Dimension 5:  Relapse, continued use, or continued problem potential critiera description: sober x 2-3 years a couple of years ago; hx of schizoaffective d/o  Dimension 6:  Recovery/Living Environment:  Dimension 6:  Recovery/Iiving environment criteria description: homeless  ASAM Severity Score: ASAM's Severity Rating Score: 15  ASAM Recommended Level of Treatment: ASAM Recommended Level of Treatment: Level III Residential Treatment   Substance use Disorder (SUD) Substance Use Disorder (SUD)  Checklist Symptoms of Substance Use: Continued use despite having a persistent/recurrent physical/psychological problem caused/exacerbated by use, Continued use despite persistent or recurrent social, interpersonal problems, caused or exacerbated by use, Evidence of withdrawal (Comment), Large amounts of time spent to obtain, use or recover from the substance(s), Persistent desire or unsuccessful efforts to cut down or control use, Presence of craving or strong urge to use, Recurrent use that results in a failure to fulfill major role obligations (work, school, home), Repeated use in physically hazardous situations, Social, occupational, recreational activities given up or reduced due to use, Substance(s) often taken in larger amounts or over longer times than was intended  Recommendations for Services/Supports/Treatments: Recommendations for Services/Supports/Treatments Recommendations For Services/Supports/Treatments: ACCTT Engineer, agricultural Treatment), Residential-Level 3, Inpatient Hospitalization, Medication Management, Other (Comment) (Pt is a veteran)   DSM5 Diagnoses: Patient Active Problem List   Diagnosis Date Noted   MDD (major depressive disorder), recurrent, severe, with psychosis (HCC) 11/21/2015   Cocaine use disorder, mild, abuse (HCC) 11/21/2015   Alcohol use disorder, severe, dependence (HCC) 11/20/2015    Class: Acute   Alcohol intoxication (HCC)    Syncope 09/24/2015   Alcohol  withdrawal (HCC) 09/23/2015   Altered mental status 09/23/2015      Alverto Shedd Suzan Nailer, LCSW

## 2020-09-30 NOTE — Discharge Instructions (Addendum)
Pt is acutely intoxicated. Pt transferred to [facility] ED via safe transportation. Pt can come back to [facility] if needed after he is medically cleared. Accepting ED Dr Adela Lank .

## 2020-09-30 NOTE — ED Provider Notes (Signed)
Behavioral Health Urgent Care Medical Screening Exam  Patient Name: Robert Hurley MRN: [id number] Date of Evaluation: 09/30/20 Chief Complaint:   Diagnosis:  Final diagnoses:  Alcohol abuse with intoxication (HCC)    History of Present illness: Robert Hurley is a 50 y.o. male.  with past medical history of schizoaffective disorder, alcohol abuse disorder, cocaine use disorder, and MDD presented to [facility] seeking help with alcohol abuse.  Patient states he drank 2 gallons of liquor, wine, and and beer from 7:30 PM to 2 AM last night.  He reports using cocaine and marijuana with alcohol.  He reports anxiety, tremors, abdominal pain, dizziness, nausea since then.  Patient reports drinking alcohol since age 36 and drinks every day and uses cocaine and marijuana twice weekly.  Patient denies SI, HI, auditory and visual hallucinations.  He reports poor sleep and appetite, fatigue, and low energy.  He denies hopelessness, helplessness, worthlessness.  Patient states he is homeless and lives on streets.  Patient states he has a history of suicidal attempt when he was 15. Patient states he has not been taking any medication for schizoaffective disorder. Pt has multiple admissions to the ED with similar symptoms.   Psychiatric Specialty Exam  Presentation  General Appearance:Disheveled  Eye Contact:Minimal  Speech:Slow  Speech Volume:Normal  Handedness:No data recorded  Mood and Affect  Mood:Anxious; Dysphoric  Affect:Congruent   Thought Process  Thought Processes:Coherent  Descriptions of Associations:Intact  Orientation:Full (Time, Place and Person)  Thought Content:Logical    Hallucinations:None  Ideas of Reference:None  Suicidal Thoughts:No  Homicidal Thoughts:No   Sensorium  Memory:Immediate Fair; Recent Fair; Remote Fair  Judgment:Poor  Insight:Fair   Executive Functions  Concentration:Fair  Attention Span:Fair  Recall:Fair  Fund of  Knowledge:Fair  Language:Fair   Psychomotor Activity  Psychomotor Activity:Increased; Tremor   Assets  Assets:Communication Skills; Desire for Improvement   Sleep  Sleep:Poor  Number of hours:  No data recorded  No data recorded  Physical Exam: Physical Exam Vitals and nursing note reviewed.  Constitutional:      General: He is in acute distress.     Appearance: He is ill-appearing, toxic-appearing and diaphoretic.  HENT:     Head: Normocephalic and atraumatic.  Pulmonary:     Effort: Pulmonary effort is normal.  Neurological:     General: No focal deficit present.     Mental Status: He is alert and oriented to person, place, and time.  Psychiatric:        Mood and Affect: Mood normal.        Behavior: Behavior normal.        Thought Content: Thought content normal.   Review of Systems  Constitutional:  Positive for diaphoresis. Negative for chills and fever.  HENT:  Negative for hearing loss.   Eyes:  Negative for blurred vision.  Respiratory:  Negative for cough.   Cardiovascular:  Negative for chest pain.  Gastrointestinal:  Positive for abdominal pain and nausea. Negative for vomiting.  Skin:  Negative for rash.  Neurological:  Positive for dizziness and tremors. Negative for focal weakness and headaches.  Psychiatric/Behavioral:  Positive for substance abuse. Negative for depression, hallucinations, memory loss and suicidal ideas. The patient is nervous/anxious and has insomnia.   Blood pressure (!) 147/90, pulse (!) 104, temperature 99.1 F (37.3 C), temperature source Oral, resp. rate 16, SpO2 100 %. There is no height or weight on file to calculate BMI.  Musculoskeletal: Strength & Muscle Tone: within normal limits Gait & Station: normal  Patient leans: N/A   [facility] MSE Discharge Disposition for Follow up and Recommendations: Pt is intoxicated. Pt is being transferred to [facility] ED.  Provider Handoff given to Dr Adela Lank and the provider has agreed to accept  the patient. Pt can come back to [facility] if needed after he is medically cleared.    Karsten Ro, MD 09/30/2020, 3:48 PM

## 2020-09-30 NOTE — ED Notes (Signed)
Attempt made to get pt for triage with no respond x 1.

## 2020-09-30 NOTE — Progress Notes (Signed)
RN called to inform Charge nurse Alycia Rossetti to inform patient being sent by Dr. Leone Haven for med clearance.

## 2020-10-07 ENCOUNTER — Encounter (HOSPITAL_COMMUNITY): Payer: Self-pay

## 2020-10-07 ENCOUNTER — Emergency Department (HOSPITAL_COMMUNITY): Payer: Medicaid Other

## 2020-10-07 ENCOUNTER — Emergency Department (HOSPITAL_COMMUNITY)
Admission: EM | Admit: 2020-10-07 | Discharge: 2020-10-08 | Disposition: A | Discharge status: Home / Self Care | Payer: Medicaid Other | Attending: Emergency Medicine | Admitting: Emergency Medicine

## 2020-10-07 ENCOUNTER — Other Ambulatory Visit: Payer: Self-pay

## 2020-10-07 DIAGNOSIS — W19XXXA Unspecified fall, initial encounter: Secondary | ICD-10-CM | POA: Insufficient documentation

## 2020-10-07 DIAGNOSIS — S0990XA Unspecified injury of head, initial encounter: Secondary | ICD-10-CM

## 2020-10-07 DIAGNOSIS — F10229 Alcohol dependence with intoxication, unspecified: Secondary | ICD-10-CM | POA: Insufficient documentation

## 2020-10-07 DIAGNOSIS — Z79899 Other long term (current) drug therapy: Secondary | ICD-10-CM | POA: Diagnosis not present

## 2020-10-07 DIAGNOSIS — Y901 Blood alcohol level of 20-39 mg/100 ml: Secondary | ICD-10-CM | POA: Insufficient documentation

## 2020-10-07 DIAGNOSIS — F17210 Nicotine dependence, cigarettes, uncomplicated: Secondary | ICD-10-CM | POA: Insufficient documentation

## 2020-10-07 DIAGNOSIS — F1910 Other psychoactive substance abuse, uncomplicated: Secondary | ICD-10-CM | POA: Diagnosis not present

## 2020-10-07 DIAGNOSIS — R0789 Other chest pain: Secondary | ICD-10-CM

## 2020-10-07 LAB — COMPREHENSIVE METABOLIC PANEL
ALT: 86 U/L — ABNORMAL HIGH (ref 0–44)
AST: 122 U/L — ABNORMAL HIGH (ref 15–41)
Albumin: 3.9 g/dL (ref 3.5–5.0)
Alkaline Phosphatase: 62 U/L (ref 38–126)
Anion gap: 13 (ref 5–15)
BUN: 9 mg/dL (ref 6–20)
CO2: 21 mmol/L — ABNORMAL LOW (ref 22–32)
Calcium: 9 mg/dL (ref 8.9–10.3)
Chloride: 103 mmol/L (ref 98–111)
Creatinine, Ser: 0.82 mg/dL (ref 0.61–1.24)
GFR, Estimated: >60 mL/min (ref >60)
Glucose, Bld: 65 mg/dL — ABNORMAL LOW (ref 70–99)
Potassium: 3.5 mmol/L (ref 3.5–5.1)
Sodium: 137 mmol/L (ref 135–145)
Total Bilirubin: 0.8 mg/dL (ref 0.3–1.2)
Total Protein: 7.6 g/dL (ref 6.5–8.1)

## 2020-10-07 LAB — CBC WITH DIFFERENTIAL/PLATELET
Abs Immature Granulocytes: 0 10*3/uL (ref 0.00–0.07)
Basophils Absolute: 0 10*3/uL (ref 0.0–0.1)
Basophils Relative: 1 %
Eosinophils Absolute: 0 10*3/uL (ref 0.0–0.5)
Eosinophils Relative: 0 %
HCT: 42.7 % (ref 39.0–52.0)
Hemoglobin: 14.6 g/dL (ref 13.0–17.0)
Immature Granulocytes: 0 %
Lymphocytes Relative: 41 %
Lymphs Abs: 1.7 10*3/uL (ref 0.7–4.0)
MCH: 30.9 pg (ref 26.0–34.0)
MCHC: 34.2 g/dL (ref 30.0–36.0)
MCV: 90.3 fL (ref 80.0–100.0)
Monocytes Absolute: 0.4 10*3/uL (ref 0.1–1.0)
Monocytes Relative: 10 %
Neutro Abs: 2 10*3/uL (ref 1.7–7.7)
Neutrophils Relative %: 48 %
Platelets: 213 10*3/uL (ref 150–400)
RBC: 4.73 MIL/uL (ref 4.22–5.81)
RDW: 15.9 % — ABNORMAL HIGH (ref 11.5–15.5)
WBC: 4.1 10*3/uL (ref 4.0–10.5)
nRBC: 0 % (ref 0.0–0.2)

## 2020-10-07 LAB — RAPID URINE DRUG SCREEN, HOSP PERFORMED
Amphetamines: NOT DETECTED
Barbiturates: NOT DETECTED
Benzodiazepines: NOT DETECTED
Cocaine: POSITIVE — AB
Opiates: NOT DETECTED
Tetrahydrocannabinol: POSITIVE — AB

## 2020-10-07 LAB — ETHANOL: Alcohol, Ethyl (B): 33 mg/dL — ABNORMAL HIGH (ref <10)

## 2020-10-07 LAB — LIPASE, BLOOD: Lipase: 27 U/L (ref 11–51)

## 2020-10-07 LAB — ACETAMINOPHEN LEVEL: Acetaminophen (Tylenol), Serum: <10 ug/mL — ABNORMAL LOW (ref 10–30)

## 2020-10-07 LAB — CBG MONITORING, ED
Glucose-Capillary: 60 mg/dL — ABNORMAL LOW (ref 70–99)
Glucose-Capillary: 138 mg/dL — ABNORMAL HIGH (ref 70–99)

## 2020-10-07 LAB — SALICYLATE LEVEL: Salicylate Lvl: <7 mg/dL — ABNORMAL LOW (ref 7.0–30.0)

## 2020-10-07 NOTE — ED Triage Notes (Signed)
Pt reports fall x2 days ago and reports hitting his head. Pt endorses head pain and tremors. Pt reports heavy drinking throughout the night and earlier today. Pt states he feels like he is going through withdrawals.

## 2020-10-07 NOTE — ED Notes (Signed)
Pt given a cup of orange juice and a sandwich after assessing CBG.

## 2020-10-07 NOTE — ED Notes (Signed)
Pt not found in the triage room when going to reassess a CBG. After the first CBG, he was given 16 ounces of orange juice and two sandwiches.

## 2020-10-07 NOTE — ED Provider Notes (Signed)
Emergency Medicine Provider Triage Evaluation Note  Robert Hurley , a 50 y.o. male  was evaluated in triage.  Pt complains of fall.  Apparently had a fall 2 days ago.  Hit the back of his head.  He feels like he is going through alcohol withdrawal.  Patient states he drinks from the time he wakes up till time he goes to bed.  He does not count drinks however states "I drink until I pass out."  He denies any illicit substance use.  He last drink PTA.  He denies any AVH.  States he feels nauseous.  Review of Systems  Positive: Fall, headache, EtOH intoxication Negative: Blurred vision, numbness, chest pain, abdominal pain  Physical Exam  BP (!) 133/99 (BP Location: Left Arm)   Pulse 94   Temp 98.5 F (36.9 C) (Oral)   Resp 18   SpO2 95%  Gen:   Awake, no distress , disheveled, dried emesis on shirt Resp:  Normal effort  MSK:   Moves extremities without difficulty, no tremors Other:    Medical Decision Making  Medically screening exam initiated at 6:22 PM.  Appropriate orders placed.  Robert Hurley was informed that the remainder of the evaluation will be completed by another provider, this initial triage assessment does not replace that evaluation, and the importance of remaining in the ED until their evaluation is complete.  Fall, headache, EtOH use   Kiasia Chou A, PA-C 10/07/20 Robert Chough, MD 10/08/20 [phone]

## 2020-10-08 ENCOUNTER — Emergency Department (HOSPITAL_COMMUNITY): Payer: Medicaid Other

## 2020-10-08 LAB — CBG MONITORING, ED: Glucose-Capillary: 76 mg/dL (ref 70–99)

## 2020-10-08 NOTE — Discharge Instructions (Addendum)
Substance Abuse Treatment Programs ° °Intensive Outpatient Programs °[facility]     °[address]      °[place], [place]                   °[phone]      ° °[facility] °[address] °[place], [place] °[phone] ° °[facility]     °(Inpatient and outpatient)     °[address]           °[phone]   ° °[facility] °[phone] (Suboxone and Methadone) ° °[address]      °[place], [place] [address]      °336-882-2125      ° °[address] °[place], [place] °852-3033 ° °[facility] (Outpatient/Inpatient, Chemical)    °(insurance only) [phone]      °       °[facility] (Groups & Residential) °[place], [place] °336-389-1413 ° °   °[facility]     °[address]     °[place], [place]      °[phone]      ° °[facility] (for caregivers and family) °[address] °[place], [place] °336-299-4655 ° ° ° ° ° °Residential Treatment Programs °[facility]      °[address], [place], [place] [address]  °([phone]      ° °[facility] °[address] [place], [place] [address] °[phone] ° °[facility]        °336-248-8914      ° °[facility] °1-800-659-3381 ° °[facility]             °[address]                                         °[place], [place]                                                °877-615-2722 or 336-784-9470                              ° °[facility] [place] °[address] °[place], [address] °1.877.941.8954 ° °[facility]    °[address]      °[place], [place]     °[phone]      ° °[facility] °[address] °[place], [place] °336-285-9073 ° °[facility]   °[address]     °[place], [name] [address]     °[phone]      °Admissions: 8am-3pm M-F ° °[facility] °[address] °[place],  [place] °336-227-7417 ° °[facility]: Residential Program (90 Days)   °[place], [place]      °336-725-8389 or 800-758-6077    ° °[facility] °[place], [place] °(Walk in Hours over the weekend or by referral) ° °[facility] °[address], [place], [place] [address] °(336) [phone] ° °Crisis Mobile: [facility]:  1-877-626-1772 (for crisis response 24 hours a day) °[facility] Hotline:      1-800-256-2452 °Outpatient Psychiatry and Counseling ° °[facility]: Mobile Crisis   Management 24 hours:  1-877-626-1772 ° °[facility] sliding scale fee and walk in schedule: M-F 8am-12pm/1pm-3pm °[address]  °[place], [place] [address] °[phone] ° °[facility] °[address] °[place], [place] [address] °336-703-9650 ° °[facility] ([facility] known [facility])- new patient walk-in appointments available Monday - Friday 8am -3pm.          °[address] °[place], [place] [address] °336-676-6840 or crisis line- 336-676-6905 ° °[facility] Intensive Outpatient Therapy Program °[address] °[place], [place] [address] °336-832-9804 ° °[facility]                  °Crisis Services      °[phone]      °[address]     °[place], [place] [address]                ° °[facility]   °[facility] °[phone] °[address] °[place], [place] [address] ° ° °[facility]          °[address]  °[place], [place] [address]       °([phone] ° °[facility] °[address] °[place], [place] [address] °336-292-1510 ° °[facility]    °[address]    °[place], [place] [address]     °[phone]      ° °Parish McKinney, MD     °[address]     °[place] [address]     °336-282-1251     °  °[facility] °[address] °[place] [address] ° °[facility]     °[address]     °[place], [place]      °336-542-2076      ° °[facility] °Shamsher Ahluwalia, MD °[address] °[place], [place] [address] °[phone] ° °[facility]     °[address]     °[place], [place] [address]     °336-274-1237      ° °[facility] °[address] °[place], [place] [address] °336-854-4450 °Resources for Temporary Residential Assistance/Crisis Centers ° °DAY CENTERS °[facility] °M-F 8am-3pm   °[address] [place], [place] [address]   336-332-0824 °Services include: laundry, barbering, support groups, case management, phone  & computer access, showers, AA/NA mtgs, mental health/substance abuse nurse, job skills class, disability information, VA assistance, spiritual classes, etc.  ° °HOMELESS SHELTERS ° °[facility]     °[facility]   °[address], [place]     °[phone]       °       °[facility] (women and children)       °[address] °[place], [place] [address] °336-275-0820 °[email or web address] for application and process °Application Required ° °[facility]   °[address]    °[place] [address]     °[phone]       °             °[facility] °[address] °, [place] [address] °336.273.5572 °336-235-0363(schedule application appt.) °Application Required ° °[facility] (women only)    °[address]     °[place], [place] [address]     °336-884-1039      °  Intake starts 6pm daily °Need valid ID, SSC, & Police report °[facility] [place] °[address] °[place], [place] °336-881-5420 °Application Required ° °[facility] (men only)     °[address]      °[place], [place]     °[phone]      ° °[facility] °(Pregnant women only) °[address] °[place], [place] °336-275-0206 ° °[facility]      °[address]      °[place], [place] [address]     °[phone]      °       °[facility] °[address] °[place], [place] °336-723-1848 °90 day commitment/SA/Application process ° °[facility](men only)     °[address]     °[place], [place]     °[phone]       °Check-in at 7pm     °       °[facility] [place] [facility] °[address] °[place], [place] [address] °336-248-6684 °Men/Women/Women and Children must be there by 7 pm ° °[facility] °[place],  °336-722-8721                ° °

## 2020-10-08 NOTE — ED Notes (Signed)
Patient transported to X-ray 

## 2020-10-08 NOTE — ED Provider Notes (Signed)
Emergency Department Provider Note   I have reviewed the triage vital signs and the nursing notes.   HISTORY  Chief Complaint Fall and Alcohol Intoxication   HPI Robert Hurley is a 50 y.o. male with PMH reviewed below including polysubstance abuse presents to the ED with HA after fall 3 days prior. He tells me his is an alcoholic and has been drinking heavily for years but especially over the last 4 days. Denies any SI/HI. Notes that is also using drugs. He states that while intoxicated he fell backwards and hit the back of his head. He thinks he may have passed out briefly. Has had posterior HA and right chest wall pain since. Chest pain is worse with touching or moving. No SOB. No fever, chills, or productive cough. He tells me he has not been eating and mainly is drinking EtOH. No history of complicated EtOH withdrawal.   Past Medical History:  Diagnosis Date   Schizoaffective disorder [facility])     Patient Active Problem List   Diagnosis Date Noted   MDD (major depressive disorder), recurrent, severe, with psychosis (HCC) 11/21/2015   Cocaine use disorder, mild, abuse (HCC) 11/21/2015   Alcohol use disorder, severe, dependence (HCC) 11/20/2015    Class: Acute   Alcohol intoxication (HCC)    Syncope 09/24/2015   Alcohol withdrawal (HCC) 09/23/2015   Altered mental status 09/23/2015    History reviewed. No pertinent surgical history.  Allergies Lamictal [lamotrigine] and Seroquel [quetiapine fumarate]  Family History  Problem Relation Age of Onset   Alcoholism Maternal Aunt    Alcoholism Maternal Uncle     Social History Social History   Tobacco Use   Smoking status: Every Day    Packs/day: 1.00    Types: Cigarettes   Smokeless tobacco: Never  Substance Use Topics   Alcohol use: Yes    Comment: 14 gallons per week (2 gallons per day)   Drug use: Not Currently    Review of Systems  Constitutional: No fever/chills Eyes: No visual changes. ENT: No  sore throat. Cardiovascular: Denies chest pain. Respiratory: Denies shortness of breath. Gastrointestinal: No abdominal pain.  No nausea, no vomiting.  No diarrhea.  No constipation. Genitourinary: Negative for dysuria. Musculoskeletal: Negative for back pain. Positive right chest wall pain.  Skin: Negative for rash. Neurological: Negative for focal weakness or numbness. Posterior HA.  Psychiatric: Positive depression (chronic). Denies SI/HI.   10-point ROS otherwise negative.  ____________________________________________   PHYSICAL EXAM:  VITAL SIGNS: ED Triage Vitals  Enc Vitals Group     BP 10/07/20 1817 (!) 133/99     Pulse Rate 10/07/20 1817 94     Resp 10/07/20 1817 18     Temp 10/07/20 1817 98.5 F (36.9 C)     Temp Source 10/07/20 1817 Oral     SpO2 10/07/20 1817 95 %   Constitutional: Alert and oriented. Well appearing and in no acute distress. Eyes: Conjunctivae are normal. PERRL. EOMI. Head: Atraumatic. Nose: No congestion/rhinnorhea. Mouth/Throat: Mucous membranes are slightly dry.  Neck: No stridor.  No cervical spine tenderness to palpation. Cardiovascular: Normal rate, regular rhythm. Good peripheral circulation. Grossly normal heart sounds.   Respiratory: Normal respiratory effort.  No retractions. Lungs CTAB. Gastrointestinal: Soft and nontender. No distention.  Musculoskeletal: No lower extremity tenderness nor edema. No gross deformities of extremities. Neurologic:  Normal speech and language. No gross focal neurologic deficits are appreciated.  Skin:  Skin is warm, dry and intact. No rash noted. Psychiatric: Mood  and affect are slightly flat. Speech and behavior are normal.  ____________________________________________   LABS (all labs ordered are listed, but only abnormal results are displayed)  Labs Reviewed  CBC WITH DIFFERENTIAL/PLATELET - Abnormal; Notable for the following components:      Result Value   RDW 15.9 (*)    All other components  within normal limits  COMPREHENSIVE METABOLIC PANEL - Abnormal; Notable for the following components:   CO2 21 (*)    Glucose, Bld 65 (*)    AST 122 (*)    ALT 86 (*)    All other components within normal limits  RAPID URINE DRUG SCREEN, HOSP PERFORMED - Abnormal; Notable for the following components:   Cocaine POSITIVE (*)    Tetrahydrocannabinol POSITIVE (*)    All other components within normal limits  ETHANOL - Abnormal; Notable for the following components:   Alcohol, Ethyl (B) 33 (*)    All other components within normal limits  SALICYLATE LEVEL - Abnormal; Notable for the following components:   Salicylate Lvl <7.0 (*)    All other components within normal limits  ACETAMINOPHEN LEVEL - Abnormal; Notable for the following components:   Acetaminophen (Tylenol), Serum <10 (*)    All other components within normal limits  CBG MONITORING, ED - Abnormal; Notable for the following components:   Glucose-Capillary 60 (*)    All other components within normal limits  CBG MONITORING, ED - Abnormal; Notable for the following components:   Glucose-Capillary 138 (*)    All other components within normal limits  LIPASE, BLOOD  CBG MONITORING, ED   ____________________________________________  EKG   EKG Interpretation  Date/Time:  Friday October 07 2020 20:05:01 EDT Ventricular Rate:  81 PR Interval:  178 QRS Duration: 86 QT Interval:  396 QTC Calculation: 460 R Axis:   76 Text Interpretation: Normal sinus rhythm Normal ECG Confirmed by Alona Bene [phone]) on 10/08/2020 2:36:11 AM        ____________________________________________  RADIOLOGY  DG Ribs Unilateral W/Chest Right  Result Date: 10/08/2020 CLINICAL DATA:  Status post fall with right chest wall pain. EXAM: RIGHT RIBS AND CHEST - 3+ VIEW COMPARISON:  June 25, 2020 FINDINGS: A radiopaque marker was placed at the site of the patient's pain. No fracture or other bone lesions are seen involving the ribs. Chronic seventh  and eighth left rib fractures are seen. There is no evidence of pneumothorax or pleural effusion. Both lungs are clear. Heart size and mediastinal contours are within normal limits. IMPRESSION: No acute right-sided rib fractures. Electronically Signed   By: Aram Candela M.D.   On: 10/08/2020 03:21   CT Head Wo Contrast  Result Date: 10/07/2020 CLINICAL DATA:  Neck pain, acute, no red flags. Facial trauma. Pt reports fall x2 days ago and reports hitting his head. Pt endorses head pain and tremors. Pt reports heavy drinking throughout the night and earlier today. Pt states he feels like he is going through withdrawals. EXAM: CT HEAD WITHOUT CONTRAST CT CERVICAL SPINE WITHOUT CONTRAST TECHNIQUE: Multidetector CT imaging of the head and cervical spine was performed following the standard protocol without intravenous contrast. Multiplanar CT image reconstructions of the cervical spine were also generated. COMPARISON:  None. FINDINGS: CT HEAD FINDINGS Brain: No evidence of large-territorial acute infarction. No parenchymal hemorrhage. No mass lesion. No extra-axial collection. No mass effect or midline shift. No hydrocephalus. Basilar cisterns are patent. Vascular: No hyperdense vessel. Atherosclerotic calcifications are present within the cavernous internal carotid arteries. Skull: No acute  fracture or focal lesion. Sinuses/Orbits: Paranasal sinuses and mastoid air cells are clear. The orbits are unremarkable. Other: None. CT CERVICAL SPINE FINDINGS Alignment: Normal. Skull base and vertebrae: Multilevel mild to moderate degenerative changes of the spine. No acute fracture. No aggressive appearing focal osseous lesion or focal pathologic process. Soft tissues and spinal canal: No prevertebral fluid or swelling. No visible canal hematoma. Upper chest: Unremarkable. Other: None. IMPRESSION: 1. No acute intracranial abnormality. 2. No acute displaced fracture or traumatic listhesis of the cervical spine.  Electronically Signed   By: Tish Frederickson M.D.   On: 10/07/2020 18:45   CT Cervical Spine Wo Contrast  Result Date: 10/07/2020 CLINICAL DATA:  Neck pain, acute, no red flags. Facial trauma. Pt reports fall x2 days ago and reports hitting his head. Pt endorses head pain and tremors. Pt reports heavy drinking throughout the night and earlier today. Pt states he feels like he is going through withdrawals. EXAM: CT HEAD WITHOUT CONTRAST CT CERVICAL SPINE WITHOUT CONTRAST TECHNIQUE: Multidetector CT imaging of the head and cervical spine was performed following the standard protocol without intravenous contrast. Multiplanar CT image reconstructions of the cervical spine were also generated. COMPARISON:  None. FINDINGS: CT HEAD FINDINGS Brain: No evidence of large-territorial acute infarction. No parenchymal hemorrhage. No mass lesion. No extra-axial collection. No mass effect or midline shift. No hydrocephalus. Basilar cisterns are patent. Vascular: No hyperdense vessel. Atherosclerotic calcifications are present within the cavernous internal carotid arteries. Skull: No acute fracture or focal lesion. Sinuses/Orbits: Paranasal sinuses and mastoid air cells are clear. The orbits are unremarkable. Other: None. CT CERVICAL SPINE FINDINGS Alignment: Normal. Skull base and vertebrae: Multilevel mild to moderate degenerative changes of the spine. No acute fracture. No aggressive appearing focal osseous lesion or focal pathologic process. Soft tissues and spinal canal: No prevertebral fluid or swelling. No visible canal hematoma. Upper chest: Unremarkable. Other: None. IMPRESSION: 1. No acute intracranial abnormality. 2. No acute displaced fracture or traumatic listhesis of the cervical spine. Electronically Signed   By: Tish Frederickson M.D.   On: 10/07/2020 18:45    ____________________________________________   PROCEDURES  Procedure(s) performed:   Procedures  None   ____________________________________________   INITIAL IMPRESSION / ASSESSMENT AND PLAN / ED COURSE  Pertinent labs & imaging results that were available during my care of the patient were reviewed by me and considered in my medical decision making (see chart for details).   Patient presents to the ED for evaluation of fall with HA.  Patient had MSE exam with CT imaging of the head and cervical spine which was reviewed showing no acute findings.  Labs reviewed.  Mild hyperglycemia but improved with eating.  Patient has been on a several day bender with alcohol.  No signs of withdrawal.  No history of complicated withdrawal.  Denies any SI/HI.  Plain film of the chest and right ribs obtained with some soreness in that area.  Pain is very reproducible and not consistent with ACS or PE.  EKG interpreted by me as above.  Plan for discharge with list of area resources.    ____________________________________________  FINAL CLINICAL IMPRESSION(S) / ED DIAGNOSES  Final diagnoses:  Fall, initial encounter  Injury of head, initial encounter  Chest wall pain  Polysubstance abuse (HCC)     Note:  This document was prepared using Dragon voice recognition software and may include unintentional dictation errors.  Alona Bene, MD, [facility] Emergency Medicine    Lyndi Holbein, Arlyss Repress, MD 10/08/20 [phone]

## 2020-10-08 NOTE — ED Notes (Addendum)
Pt provided with food (sandwich, cheese stick, and cran-grape juice) after checking the last CBG.

## 2021-09-26 ENCOUNTER — Encounter (HOSPITAL_COMMUNITY): Payer: Self-pay

## 2021-09-26 ENCOUNTER — Emergency Department (HOSPITAL_COMMUNITY): Payer: Medicaid Other

## 2021-09-26 ENCOUNTER — Emergency Department (HOSPITAL_COMMUNITY)
Admission: EM | Admit: 2021-09-26 | Discharge: 2021-09-27 | Disposition: A | Discharge status: Home / Self Care | Payer: Medicaid Other | Attending: Emergency Medicine | Admitting: Emergency Medicine

## 2021-09-26 ENCOUNTER — Other Ambulatory Visit: Payer: Self-pay

## 2021-09-26 DIAGNOSIS — F22 Delusional disorders: Secondary | ICD-10-CM | POA: Insufficient documentation

## 2021-09-26 DIAGNOSIS — R45851 Suicidal ideations: Secondary | ICD-10-CM | POA: Insufficient documentation

## 2021-09-26 DIAGNOSIS — R4182 Altered mental status, unspecified: Secondary | ICD-10-CM | POA: Diagnosis not present

## 2021-09-26 DIAGNOSIS — Z20822 Contact with and (suspected) exposure to covid-19: Secondary | ICD-10-CM | POA: Insufficient documentation

## 2021-09-26 DIAGNOSIS — Z87898 Personal history of other specified conditions: Secondary | ICD-10-CM

## 2021-09-26 DIAGNOSIS — F259 Schizoaffective disorder, unspecified: Secondary | ICD-10-CM

## 2021-09-26 DIAGNOSIS — R456 Violent behavior: Secondary | ICD-10-CM | POA: Diagnosis present

## 2021-09-26 DIAGNOSIS — Z046 Encounter for general psychiatric examination, requested by authority: Secondary | ICD-10-CM

## 2021-09-26 LAB — CBC WITH DIFFERENTIAL/PLATELET
Abs Immature Granulocytes: 0.01 10*3/uL (ref 0.00–0.07)
Basophils Absolute: 0 10*3/uL (ref 0.0–0.1)
Basophils Relative: 0 %
Eosinophils Absolute: 0 10*3/uL (ref 0.0–0.5)
Eosinophils Relative: 0 %
HCT: 37.5 % — ABNORMAL LOW (ref 39.0–52.0)
Hemoglobin: 12.7 g/dL — ABNORMAL LOW (ref 13.0–17.0)
Immature Granulocytes: 0 %
Lymphocytes Relative: 21 %
Lymphs Abs: 1.3 10*3/uL (ref 0.7–4.0)
MCH: 29.7 pg (ref 26.0–34.0)
MCHC: 33.9 g/dL (ref 30.0–36.0)
MCV: 87.6 fL (ref 80.0–100.0)
Monocytes Absolute: 0.6 10*3/uL (ref 0.1–1.0)
Monocytes Relative: 9 %
Neutro Abs: 4.4 10*3/uL (ref 1.7–7.7)
Neutrophils Relative %: 70 %
Platelets: 217 10*3/uL (ref 150–400)
RBC: 4.28 MIL/uL (ref 4.22–5.81)
RDW: 13.5 % (ref 11.5–15.5)
WBC: 6.3 10*3/uL (ref 4.0–10.5)
nRBC: 0 % (ref 0.0–0.2)

## 2021-09-26 LAB — RAPID URINE DRUG SCREEN, HOSP PERFORMED
Amphetamines: NOT DETECTED
Barbiturates: NOT DETECTED
Benzodiazepines: NOT DETECTED
Cocaine: NOT DETECTED
Opiates: NOT DETECTED
Tetrahydrocannabinol: NOT DETECTED

## 2021-09-26 LAB — SALICYLATE LEVEL: Salicylate Lvl: <7 mg/dL — ABNORMAL LOW (ref 7.0–30.0)

## 2021-09-26 LAB — ETHANOL: Alcohol, Ethyl (B): <10 mg/dL (ref <10)

## 2021-09-26 LAB — COMPREHENSIVE METABOLIC PANEL
ALT: 13 U/L (ref 0–44)
AST: 18 U/L (ref 15–41)
Albumin: 4 g/dL (ref 3.5–5.0)
Alkaline Phosphatase: 50 U/L (ref 38–126)
Anion gap: 3 — ABNORMAL LOW (ref 5–15)
BUN: 10 mg/dL (ref 6–20)
CO2: 26 mmol/L (ref 22–32)
Calcium: 9.1 mg/dL (ref 8.9–10.3)
Chloride: 114 mmol/L — ABNORMAL HIGH (ref 98–111)
Creatinine, Ser: 0.7 mg/dL (ref 0.61–1.24)
GFR, Estimated: >60 mL/min (ref >60)
Glucose, Bld: 95 mg/dL (ref 70–99)
Potassium: 4 mmol/L (ref 3.5–5.1)
Sodium: 143 mmol/L (ref 135–145)
Total Bilirubin: 0.4 mg/dL (ref 0.3–1.2)
Total Protein: 7.4 g/dL (ref 6.5–8.1)

## 2021-09-26 LAB — ACETAMINOPHEN LEVEL: Acetaminophen (Tylenol), Serum: <10 ug/mL — ABNORMAL LOW (ref 10–30)

## 2021-09-26 MED ORDER — STERILE WATER FOR INJECTION IJ SOLN
INTRAMUSCULAR | Status: AC
  Administered 2021-09-26: 1.2 mL
  Filled 2021-09-26: qty 10

## 2021-09-26 MED ORDER — LORAZEPAM 2 MG/ML IJ SOLN
2.0000 mg | Freq: Once | INTRAMUSCULAR | Status: AC
  Administered 2021-09-26: 2 mg via INTRAMUSCULAR
  Filled 2021-09-26: qty 1

## 2021-09-26 MED ORDER — ZIPRASIDONE MESYLATE 20 MG IM SOLR
20.0000 mg | Freq: Once | INTRAMUSCULAR | Status: AC
  Administered 2021-09-26: 20 mg via INTRAMUSCULAR
  Filled 2021-09-26: qty 20

## 2021-09-26 NOTE — ED Triage Notes (Signed)
Pt brought by [facility] IVC for SI attempted in jail. Pt was received from jail today but mental health services in the jail believed patient to be at risk for hurting himself again. Pt cut the back of his head on 09/24/21 because he believed there was a chip in his head per IVC paperwork.

## 2021-09-26 NOTE — ED Notes (Signed)
Pt not placed in violent restraints, pt states that he will stay in bed and that he is tired of being tied up.

## 2021-09-26 NOTE — ED Provider Notes (Signed)
[facility] DEPT Provider Note   CSN: [id number] Arrival date & time: 09/26/21  1923     History  Chief Complaint  Patient presents with   IVC   Psychiatric Evaluation    Robert Hurley is a 51 y.o. male.  HPI Patient notes today from jail.  The officer with him is unaware of the reason he was incarcerated, how long he was incarcerated or any specifics.  He understands that the patient was recently evaluated at another health systems emergency department, 2 days ago.  He was reportedly sent there after he was "found down," in jail.  He had traumatic subarachnoid hemorrhage and intraventricular hemorrhage as well.  He was monitored until 09/25/2021, then discharged back to jail.  There is a report that the patient was trying to "cut a chip out of his head."  He presents with the Curator, from the jail, who is here just to help.  IVC petition was filled out by a mental health professional, Antony Contras.  The petition states that the patient has been diagnosed with bipolar disorder and schizophrenia and is not taking any medications.  The patient tried to cut open his head with a piece of contraband on 09/24/2021.  The patient has been incarcerated, since December because he is unpredictable and hostile he required "2 person officer policy."  The patient has been seen to be delusional and is neglecting his self hygiene.  By report that he is a risk for suicide after he is released from jail.  Patient reports to me that he wants to leave and go home.  He denies suicidal ideation.  Patient was evaluated at the [facility] emergency department 2 days ago for being found down.  He was found to have a small subarachnoid hemorrhage, that improved spontaneously and was not seen on follow-up CT imaging , and discharged with instructions to have every 4 hours neurochecks, take Keppra 500 mg twice daily for 7 days; after an  evaluation by trauma services.  He had presented to the ED after being "found down in jail."  He was found then to have a possible left rib 8 fracture.    Home Medications Prior to Admission medications   Medication Sig Start Date End Date Taking? Authorizing Provider  acamprosate (CAMPRAL) 333 MG tablet Take 2 tablets (666 mg total) by mouth 3 (three) times daily with meals. 11/28/15   Kerrie Buffalo, NP  ARIPiprazole (ABILIFY) 10 MG tablet Take 1 tablet (10 mg total) by mouth daily. 11/28/15   Kerrie Buffalo, NP  benzonatate (TESSALON PERLES) 100 MG capsule Take 2 capsules (200 mg total) by mouth 3 (three) times daily as needed for cough. FOR DISRUPTIVE DAY TIME COUGH 06/25/20   Kinnie Feil, PA-C  benztropine (COGENTIN) 0.5 MG tablet Take 1 tablet (0.5 mg total) by mouth daily. 11/28/15   Kerrie Buffalo, NP  Multiple Vitamins-Minerals (ZINC PO) Take 1 tablet by mouth daily.    [provider]  nicotine (NICODERM CQ - DOSED IN MG/24 HOURS) 21 mg/24hr patch Place 1 patch (21 mg total) onto the skin daily. 11/28/15   Kerrie Buffalo, NP  tolnaftate (ANTIFUNGAL) 1 % cream Apply 1 application topically 2 (two) times daily as needed. Fungus    [provider]  doxepin (SINEQUAN) 10 MG capsule Take 1 capsule (10 mg total) by mouth at bedtime. Patient not taking: Reported on 06/16/2018 11/28/15 09/30/20  Kerrie Buffalo, NP  sertraline (ZOLOFT)  25 MG tablet Take 1 tablet (25 mg total) by mouth daily. Patient not taking: Reported on 06/16/2018 11/28/15 09/30/20  Adonis Brook, NP      Allergies    Lamictal [lamotrigine] and Seroquel [quetiapine fumarate]    Review of Systems   Review of Systems  Physical Exam Updated Vital Signs BP (!) 132/91 (BP Location: Right Arm)   Pulse (!) 111   Temp 98.8 F (37.1 C) (Oral)   Resp 18   Ht 5\' 8"  (1.727 m)   Wt 79.4 kg   SpO2 99%   BMI 26.61 kg/m  Physical Exam Vitals and nursing note reviewed.  Constitutional:      General: He  is in acute distress.     Appearance: He is well-developed. He is not ill-appearing or diaphoretic.  HENT:     Head: Normocephalic and atraumatic.     Right Ear: External ear normal.     Left Ear: External ear normal.  Eyes:     Conjunctiva/sclera: Conjunctivae normal.     Pupils: Pupils are equal, round, and reactive to light.  Neck:     Trachea: Phonation normal.  Cardiovascular:     Rate and Rhythm: Normal rate.  Pulmonary:     Effort: Pulmonary effort is normal.  Abdominal:     General: There is no distension.  Musculoskeletal:        General: Normal range of motion.     Cervical back: Normal range of motion and neck supple.  Skin:    General: Skin is warm and dry.  Neurological:     Mental Status: He is alert and oriented to person, place, and time.     Cranial Nerves: No cranial nerve deficit.     Sensory: No sensory deficit.     Motor: No abnormal muscle tone.     Coordination: Coordination normal.  Psychiatric:        Attention and Perception: He is inattentive.        Mood and Affect: Affect is angry and inappropriate.        Speech: Speech is rapid and pressured.        Behavior: Behavior is agitated, aggressive and hyperactive.        Thought Content: Thought content is paranoid and delusional. Thought content includes suicidal ideation.        Cognition and Memory: Cognition is impaired. Memory is impaired.        Judgment: Judgment is impulsive and inappropriate.     ED Results / Procedures / Treatments   Labs (all labs ordered are listed, but only abnormal results are displayed) Labs Reviewed  SALICYLATE LEVEL - Abnormal; Notable for the following components:      Result Value   Salicylate Lvl <7.0 (*)    All other components within normal limits  ACETAMINOPHEN LEVEL - Abnormal; Notable for the following components:   Acetaminophen (Tylenol), Serum <10 (*)    All other components within normal limits  CBC WITH DIFFERENTIAL/PLATELET - Abnormal; Notable  for the following components:   Hemoglobin 12.7 (*)    HCT 37.5 (*)    All other components within normal limits  COMPREHENSIVE METABOLIC PANEL - Abnormal; Notable for the following components:   Chloride 114 (*)    Anion gap 3 (*)    All other components within normal limits  RESP PANEL BY RT-PCR (FLU A&B, COVID) ARPGX2  ETHANOL  RAPID URINE DRUG SCREEN, HOSP PERFORMED    EKG None  Radiology CT Head  Wo Contrast  Result Date: 09/26/2021 CLINICAL DATA:  Altered mental status, prior traumatic subarachnoid hemorrhage within interventricular hemorrhage at outside hospital EXAM: CT HEAD WITHOUT CONTRAST TECHNIQUE: Contiguous axial images were obtained from the base of the skull through the vertex without intravenous contrast. RADIATION DOSE REDUCTION: This exam was performed according to the departmental dose-optimization program which includes automated exposure control, adjustment of the mA and/or kV according to patient size and/or use of iterative reconstruction technique. COMPARISON:  Outside hospital [facility]) CT head dated 02/26/2021. Correlation with prior outside hospital [facility]) CT head reports dated 09/25/2021 and 09/24/2021, via [facility]. FINDINGS: Brain: No evidence of acute infarction, hemorrhage, hydrocephalus, extra-axial collection or mass lesion/mass effect. Vascular: Intracranial atherosclerosis. Skull: Normal. Negative for fracture or focal lesion. Sinuses/Orbits: The visualized paranasal sinuses are essentially clear. The mastoid air cells are unopacified. Other: Soft tissue laceration with skin staples overlying the left occiput. IMPRESSION: No evidence of acute intracranial abnormality. Soft tissue laceration with skin staples overlying the left occiput. These findings are unchanged from the most recent outside hospital CT head report dated 09/25/2021. Electronically Signed   By: Julian Hy M.D.   On: 09/26/2021 22:41     Procedures Procedures    Medications Ordered in ED Medications  ziprasidone (GEODON) injection 20 mg (20 mg Intramuscular Given 09/26/21 2146)  LORazepam (ATIVAN) injection 2 mg (2 mg Intramuscular Given 09/26/21 2145)  sterile water (preservative free) injection (1.2 mLs  Given 09/26/21 2146)    ED Course/ Medical Decision Making/ A&P Clinical Course as of 09/26/21 2332  Tue Sep 26, 2021  2331 Patient is medically cleared for treatment by psychiatry. [EW]    Clinical Course User Index [EW] Daleen Bo, MD                           Medical Decision Making Patient presents to the ED, after prolonged period of incarceration, with suicide attempt, 2 days ago and ongoing delusions and aggressive behavior.  He was petitioned by a Air traffic controller, who saw him today at the jail.  Unfortunately the petition is not complete and I needed to repeat the petition and did first examination paperwork.  Amount and/or Complexity of Data Reviewed Independent Historian:     Details: Patient gives history, additional history obtained from Curator with him as well as paperwork with the patient. External Data Reviewed: notes.    Details: Admission/ER visit, 09/25/2021, [place]: ordered.    Details: CBC, metabolic panel, alcohol level, urine drug screen, Tylenol level, aspirin level, COVID test -- normal findings except hemoglobin low Radiology: ordered and independent interpretation performed.    Details: CT head-no acute abnormality.  Risk Prescription drug management. Decision regarding hospitalization. Risk Details: Complicated patient with recent suicidal attempt by cutting his scalp, also fall with traumatic subarachnoid hemorrhage, which was previously evaluated at [facility] and found to be stable so he was discharged, yesterday.  He presents today after being released from jail, petition for IVC by a mental health  worker there.  He is psychotic, aggressive, angry and required IVC petition by me, upholding petition, with first opinion paperwork.  Patient medicated for safety with Geodon and Ativan.  Restraints placed for safety.  Patient is high risk, volatile and will require sitter and have notified security of the dangerous behavior he has exhibited in the expected escalation when he is not restrained and/or  sedated.  Patient is medically cleared for treatment by psychiatry.  He does not require medical admission at this time.  He may require psychiatric admission.  It does not appear that he is on chronic psychiatric medication.  Critical Care Total time providing critical care: 45 minutes           Final Clinical Impression(s) / ED Diagnoses Final diagnoses:  Delusional disorder (HCC)  Involuntary commitment    Rx / DC Orders ED Discharge Orders     None         Mancel Bale, MD 09/26/21 2334

## 2021-09-26 NOTE — ED Notes (Signed)
Pt cursing and yelling.

## 2021-09-26 NOTE — ED Notes (Signed)
Pt sleeping no signs of distress

## 2021-09-26 NOTE — ED Notes (Signed)
Pt is angry and cursing at the officer.

## 2021-09-27 LAB — RESP PANEL BY RT-PCR (FLU A&B, COVID) ARPGX2
Influenza A by PCR: NEGATIVE
Influenza B by PCR: NEGATIVE
SARS Coronavirus 2 by RT PCR: NEGATIVE

## 2021-09-27 MED ORDER — LEVETIRACETAM 500 MG PO TABS
500.0000 mg | ORAL_TABLET | Freq: Two times a day (BID) | ORAL | Status: DC
  Administered 2021-09-27: 500 mg via ORAL
  Filled 2021-09-27: qty 1

## 2021-09-27 MED ORDER — BENZTROPINE MESYLATE 0.5 MG PO TABS: 0.5000 mg | ORAL_TABLET | Freq: Every day | ORAL | 0 refills | Status: AC

## 2021-09-27 MED ORDER — ARIPIPRAZOLE 10 MG PO TABS: 10.0000 mg | ORAL_TABLET | Freq: Every day | ORAL | 0 refills | Status: AC

## 2021-09-27 MED ORDER — SERTRALINE HCL 25 MG PO TABS: 25.0000 mg | ORAL_TABLET | Freq: Every day | ORAL | 0 refills | Status: AC

## 2021-09-27 NOTE — ED Notes (Signed)
Pt sleeping no signs of distress

## 2021-09-27 NOTE — BH Assessment (Signed)
@  [email or web address], Clinician requested patient's nurse (Tijen, RN) to set up the TTS machine for patient's initial TTS assessment.

## 2021-09-27 NOTE — BH Assessment (Signed)
@  [phone], received a response back from patient's nurse that patient was given Geodon and sleeping at this time. Requested nursing to notify TTS when patient is ready to complete his [facility] assessment.

## 2021-09-27 NOTE — ED Notes (Signed)
Pt received his lunch, sitting up and eating his lunch

## 2021-09-27 NOTE — ED Notes (Signed)
Pt walked to the BR, and once done he began walking to the other side of the unit. When writer asked pt if he needed anything, pt stated "walk". Writer told pt that we cannot walk due to the ED being busy. Pt then asked "how long does this process take? I got professions to do". Writer told pt that his RN can talk to him and update him, as this Clinical research associate is with another pt. Pt verbalized understanding

## 2021-09-27 NOTE — Discharge Instructions (Addendum)
It was our pleasure to provide your ER care today - we hope that you feel better.  Take your meds as prescribed.  Have staples removed in one week (your doctor or urgent care).   Follow up closely with primary care doctor and behavioral health specialist in the next 1-2 weeks.   For mental health issues and/or crisis you may also go directly to the [facility] - it is open 24/7 and walk-ins are welcome.   Return to ER if worse, new symptoms, fevers, chest pain, trouble breathing, or other emergency concern.

## 2021-09-27 NOTE — ED Provider Notes (Signed)
Emergency Medicine Observation Re-evaluation Note  Robert Hurley is a 51 y.o. male, seen on rounds today.  Pt initially presented to the ED for complaints of IVC and Psychiatric Evaluation Currently, the patient is alert, cooperative. Pt indicates is feeling improved currently, and feels ready for d/c.   Physical Exam  BP 137/88 (BP Location: Right Arm)   Pulse 88   Temp 98.8 F (37.1 C) (Oral)   Resp 18   Ht 1.727 m (5\' 8" )   Wt 79.4 kg   SpO2 100%   BMI 26.61 kg/m  Physical Exam General: alert, content, no distress.  Cardiac: regular rate Lungs: breathing comfortably Psych: calm, conversant. Normal mood/affect. Denies thoughts of harm to self or others. Pt currently does not appear to be responding to internal stimuli - no delusions or hallucinations noted   ED Course / MDM    I have reviewed the labs performed to date as well as medications administered while in observation.  Recent changes in the last 24 hours include ED obs, reassessment.   Plan  Patient indicates he feels improved and is requesting discharge. Patient is alert, and oriented, he is calm, cooperative and conversant.  On current exam, no acute psychosis noted, and no thoughts of harm to self or others.   Pt currently appears stable for d/c.  Will give rx  for meds until can see his pcp/behavioral health specialist.   Return precautions provided.         , MD 09/27/21 [phone]

## 2023-03-18 IMAGING — DX DG CHEST 1V
1 series · 1 of 1 positions shown · non-contrast
Comparison: 05/27/2018 and earlier.

CLINICAL DATA: Three week history productive cough, shortness of
breath, sinonasal congestion and headache.

EXAM:
PORTABLE CHEST 1 VIEW

[chest ap]
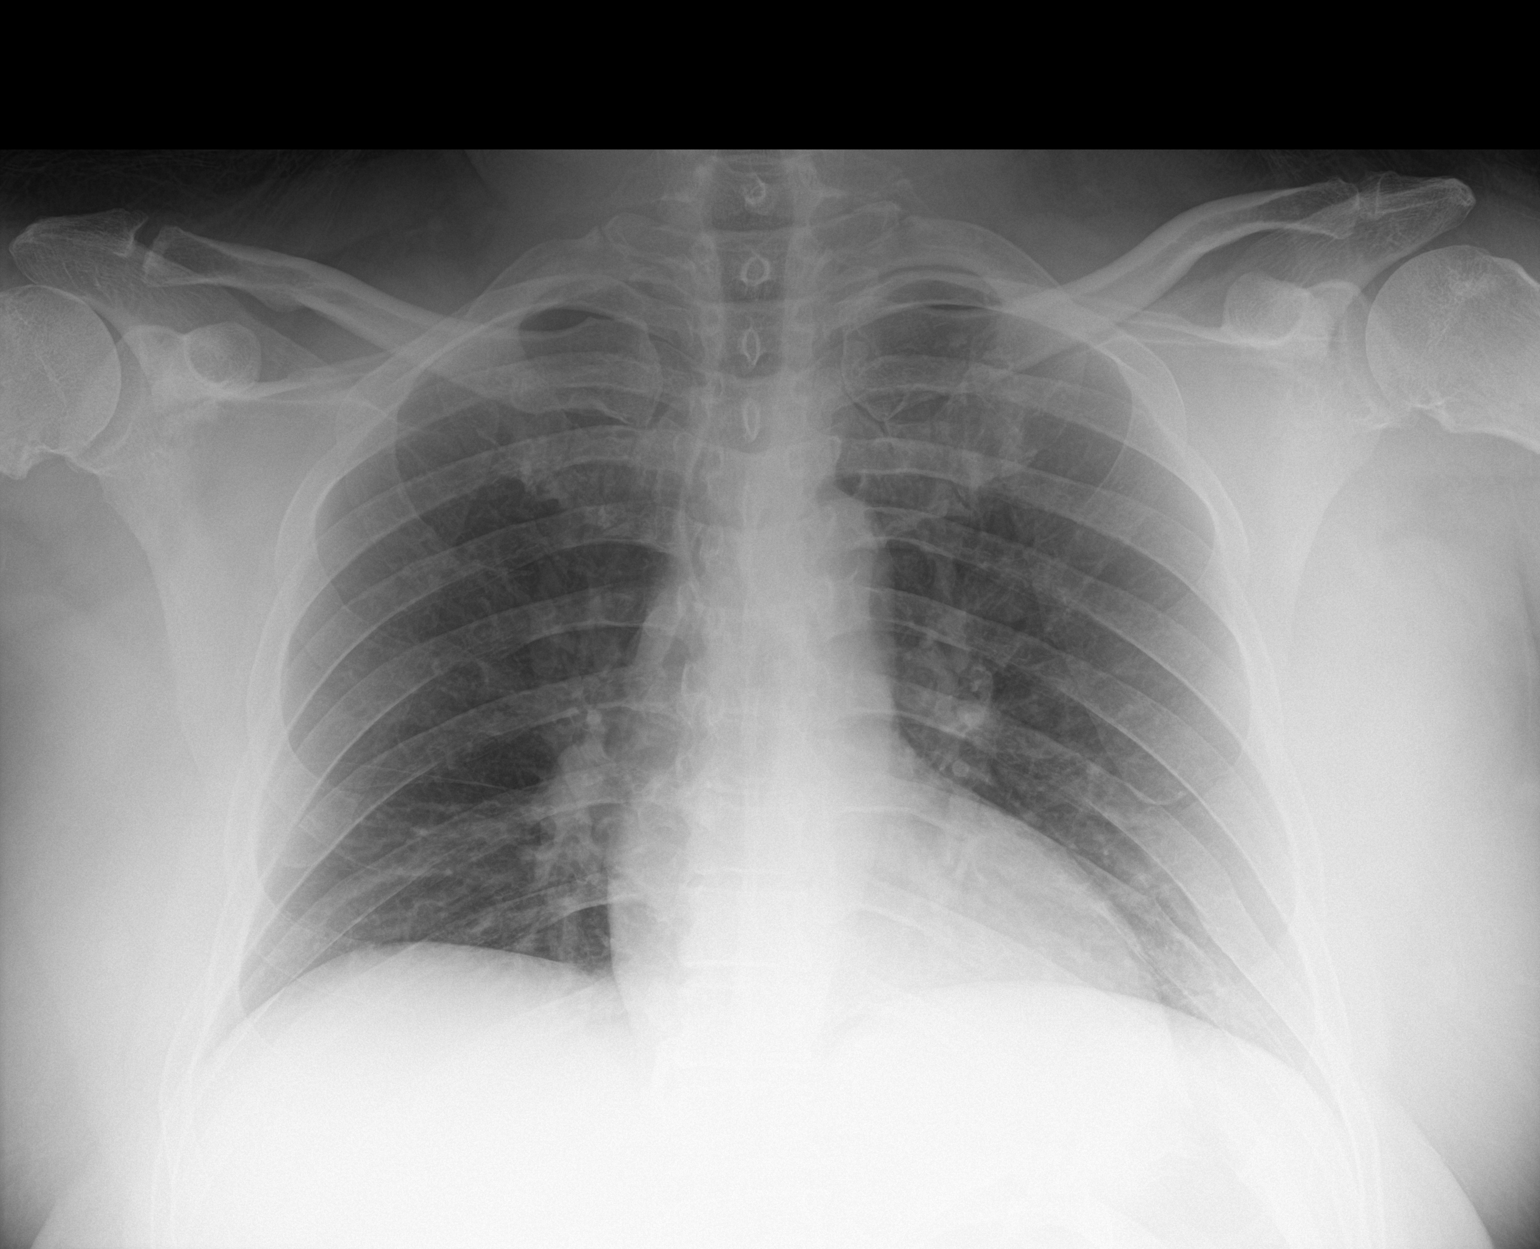

[1 of 1 positions shown; findings below may reference images not displayed]

FINDINGS: Cardiac silhouette and mediastinal contours normal in appearance for
the AP portable technique. Pulmonary parenchyma clear.
Bronchovascular markings normal. Pulmonary vascularity normal. No
pneumothorax. No visible pleural effusions.
IMPRESSION: No acute cardiopulmonary disease.
